# Patient Record
Sex: Male | Born: 1975 | Race: White | Hispanic: No | Smoking: Former smoker
Health system: Southern US, Community
[De-identification: ages and names within clinical notes are randomized; demographics above are authoritative.]

## PROBLEM LIST (undated history)

## (undated) DIAGNOSIS — D649 Anemia, unspecified: Secondary | ICD-10-CM

## (undated) DIAGNOSIS — Z789 Other specified health status: Secondary | ICD-10-CM

## (undated) DIAGNOSIS — K8020 Calculus of gallbladder without cholecystitis without obstruction: Secondary | ICD-10-CM

## (undated) DIAGNOSIS — I10 Essential (primary) hypertension: Secondary | ICD-10-CM

## (undated) HISTORY — PX: NO PAST SURGERIES: SHX2092

## (undated) HISTORY — DX: Calculus of gallbladder without cholecystitis without obstruction: K80.20

---

## 2005-01-24 ENCOUNTER — Emergency Department (HOSPITAL_COMMUNITY): Admission: EM | Admit: 2005-01-24 | Discharge: 2005-01-24 | Payer: Self-pay | Admitting: Family Medicine

## 2005-09-29 ENCOUNTER — Emergency Department (HOSPITAL_COMMUNITY): Admission: EM | Admit: 2005-09-29 | Discharge: 2005-09-29 | Payer: Self-pay | Admitting: Family Medicine

## 2006-01-27 ENCOUNTER — Emergency Department (HOSPITAL_COMMUNITY): Admission: EM | Admit: 2006-01-27 | Discharge: 2006-01-27 | Payer: Self-pay | Admitting: Family Medicine

## 2006-02-04 ENCOUNTER — Emergency Department (HOSPITAL_COMMUNITY): Admission: EM | Admit: 2006-02-04 | Discharge: 2006-02-04 | Payer: Self-pay | Admitting: Family Medicine

## 2007-05-21 ENCOUNTER — Emergency Department (HOSPITAL_COMMUNITY): Admission: EM | Admit: 2007-05-21 | Discharge: 2007-05-21 | Payer: Self-pay | Admitting: Family Medicine

## 2008-01-08 ENCOUNTER — Emergency Department (HOSPITAL_COMMUNITY): Admission: EM | Admit: 2008-01-08 | Discharge: 2008-01-08 | Payer: Self-pay | Admitting: Emergency Medicine

## 2009-01-22 ENCOUNTER — Encounter: Admission: RE | Admit: 2009-01-22 | Discharge: 2009-01-22 | Payer: Self-pay | Admitting: Specialist

## 2011-06-11 ENCOUNTER — Inpatient Hospital Stay (INDEPENDENT_AMBULATORY_CARE_PROVIDER_SITE_OTHER)
Admission: RE | Admit: 2011-06-11 | Discharge: 2011-06-11 | Disposition: A | Discharge status: Home / Self Care | Payer: BC Managed Care – PPO | Source: Ambulatory Visit | Attending: Family Medicine | Admitting: Family Medicine

## 2011-06-11 DIAGNOSIS — H60399 Other infective otitis externa, unspecified ear: Secondary | ICD-10-CM

## 2013-11-21 ENCOUNTER — Ambulatory Visit (INDEPENDENT_AMBULATORY_CARE_PROVIDER_SITE_OTHER): Payer: BC Managed Care – PPO | Admitting: Internal Medicine

## 2013-11-21 ENCOUNTER — Encounter: Payer: Self-pay | Admitting: Internal Medicine

## 2013-11-21 VITALS — BP 142/84 | HR 91 | Temp 98.3°F | Ht 74.75 in | Wt 273.5 lb

## 2013-11-21 DIAGNOSIS — R030 Elevated blood-pressure reading, without diagnosis of hypertension: Secondary | ICD-10-CM

## 2013-11-21 DIAGNOSIS — E669 Obesity, unspecified: Secondary | ICD-10-CM | POA: Insufficient documentation

## 2013-11-21 DIAGNOSIS — I10 Essential (primary) hypertension: Secondary | ICD-10-CM | POA: Insufficient documentation

## 2013-11-21 DIAGNOSIS — Z0000 Encounter for general adult medical examination without abnormal findings: Secondary | ICD-10-CM

## 2013-11-21 LAB — CBC
HCT: 49.7 % (ref 39.0–52.0)
HEMOGLOBIN: 16.3 g/dL (ref 13.0–17.0)
MCHC: 32.8 g/dL (ref 30.0–36.0)
MCV: 91.1 fl (ref 78.0–100.0)
PLATELETS: 229 10*3/uL (ref 150.0–400.0)
RBC: 5.45 Mil/uL (ref 4.22–5.81)
RDW: 13.2 % (ref 11.5–14.6)
WBC: 8.3 10*3/uL (ref 4.5–10.5)

## 2013-11-21 LAB — COMPREHENSIVE METABOLIC PANEL
ALT: 25 U/L (ref 0–53)
AST: 22 U/L (ref 0–37)
Albumin: 3.8 g/dL (ref 3.5–5.2)
Alkaline Phosphatase: 45 U/L (ref 39–117)
BILIRUBIN TOTAL: 1.2 mg/dL (ref 0.3–1.2)
BUN: 10 mg/dL (ref 6–23)
CO2: 26 mEq/L (ref 19–32)
CREATININE: 0.9 mg/dL (ref 0.4–1.5)
Calcium: 9 mg/dL (ref 8.4–10.5)
Chloride: 103 mEq/L (ref 96–112)
GFR: 99.77 mL/min (ref >60.00)
GLUCOSE: 100 mg/dL — AB (ref 70–99)
POTASSIUM: 4 meq/L (ref 3.5–5.1)
Sodium: 138 mEq/L (ref 135–145)
TOTAL PROTEIN: 6.6 g/dL (ref 6.0–8.3)

## 2013-11-21 LAB — LIPID PANEL
CHOL/HDL RATIO: 5
Cholesterol: 168 mg/dL (ref 0–200)
HDL: 32.5 mg/dL — ABNORMAL LOW (ref >39.00)
LDL CALC: 101 mg/dL — AB (ref 0–99)
Triglycerides: 172 mg/dL — ABNORMAL HIGH (ref 0.0–149.0)
VLDL: 34.4 mg/dL (ref 0.0–40.0)

## 2013-11-21 NOTE — Progress Notes (Signed)
HPI  Pt presents to the clinic today to establish care. He does not have a PCP. He goes to [facility] when he needs to be evaluated.  Flu: never Tetanus: 2009 Dentist: as needed  He would like [place] to look at his right ankle. He sprained it in 2010. He did not have any treatment at that time. He does c/o intermittent swelling. He denies pain or instability in his ankle.  History reviewed. No pertinent past medical history.  No current outpatient prescriptions on file.   No current facility-administered medications for this visit.    No Known Allergies  Family History  Problem Relation Age of Onset  . Melanoma Mother   . Bladder Cancer Father   . Diabetes Neg Hx   . Hypertension Neg Hx   . Hyperlipidemia Neg Hx   . Stroke Neg Hx     History   Social History  . Marital Status: Married    Spouse Name: N/A    Number of Children: N/A  . Years of Education: N/A   Occupational History  . Not on file.   Social History Main Topics  . Smoking status: Former Games developer  . Smokeless tobacco: Never Used  . Alcohol Use: 3.6 oz/week    6 Glasses of wine per week     Comment: occasional  . Drug Use: No  . Sexual Activity: Yes   Other Topics Concern  . Not on file   Social History Narrative  . No narrative on file    ROS:  Constitutional: Denies fever, malaise, fatigue, headache or abrupt weight changes.  HEENT: Denies eye pain, eye redness, ear pain, ringing in the ears, wax buildup, runny nose, nasal congestion, bloody nose, or sore throat. Respiratory: Denies difficulty breathing, shortness of breath, cough or sputum production.   Cardiovascular: Denies chest pain, chest tightness, palpitations or swelling in the hands or feet.  Gastrointestinal: Denies abdominal pain, bloating, constipation, diarrhea or blood in the stool.  GU: Denies frequency, urgency, pain with urination, blood in urine, odor or discharge. Musculoskeletal: Pt reports ankle swelling. Denies decrease in range of  motion, difficulty with gait, muscle pain or joint pain.  Skin: Denies redness, rashes, lesions or ulcercations.  Neurological: Denies dizziness, difficulty with memory, difficulty with speech or problems with balance and coordination.   No other specific complaints in a complete review of systems (except as listed in HPI above).  PE:  BP 142/84  Pulse 91  Temp(Src) 98.3 F (36.8 C) (Oral)  Ht 6' 2.75" (1.899 m)  Wt 273 lb 8 oz (124.059 kg)  BMI 34.40 kg/m2  SpO2 98% Wt Readings from Last 3 Encounters:  11/21/13 273 lb 8 oz (124.059 kg)    General: Appears his stated age, obese but well developed, well nourished in NAD. HEENT: Head: normal shape and size; Eyes: sclera white, no icterus, conjunctiva pink, PERRLA and EOMs intact; Ears: Tm's gray and intact, normal light reflex; Nose: mucosa pink and moist, septum midline; Throat/Mouth: Teeth present, mucosa pink and moist, no lesions or ulcerations noted.  Neck: Normal range of motion. Neck supple, trachea midline. No massses, lumps or thyromegaly present.  Cardiovascular: Normal rate and rhythm. S1,S2 noted.  No murmur, rubs or gallops noted. No JVD or BLE edema. No carotid bruits noted. Pulmonary/Chest: Normal effort and positive vesicular breath sounds. No respiratory distress. No wheezes, rales or ronchi noted.  Abdomen: Soft and nontender. Normal bowel sounds, no bruits noted. No distention or masses noted. Liver, spleen and kidneys  non palpable. Musculoskeletal: Normal flexion, extension and rotation of the right ankle. 2 + swelling noted of the lateral malleous. No difficulty with gait.  Neurological: Alert and oriented. Cranial nerves II-XII intact. Coordination normal. +DTRs bilaterally. Psychiatric: Mood and affect normal. Behavior is normal. Judgment and thought content normal.    Assessment and Plan:  Preventative Health Maintenance:  Encouraged him to work on his diet and exercise Will obtain screening labs  today Encouraged him to visit a dentist on a more regular basis  Right ankle swelling secondary to old injury:  This does not seem to bother him Advised him to wear a soft ankle brace for compression if he is very active Ok to use ice and take Ibuprofen OTC if needed  RTC in 1 year or sooner if needed

## 2013-11-21 NOTE — Progress Notes (Signed)
Pre visit review using our clinic review tool, if applicable. No additional management support is needed unless otherwise documented below in the visit note. 

## 2013-11-21 NOTE — Patient Instructions (Addendum)

## 2014-01-16 ENCOUNTER — Ambulatory Visit (INDEPENDENT_AMBULATORY_CARE_PROVIDER_SITE_OTHER): Payer: BC Managed Care – PPO | Admitting: Internal Medicine

## 2014-01-16 ENCOUNTER — Encounter: Payer: Self-pay | Admitting: Internal Medicine

## 2014-01-16 VITALS — BP 118/84 | HR 89 | Temp 98.4°F | Wt 266.2 lb

## 2014-01-16 DIAGNOSIS — L237 Allergic contact dermatitis due to plants, except food: Secondary | ICD-10-CM

## 2014-01-16 DIAGNOSIS — L255 Unspecified contact dermatitis due to plants, except food: Secondary | ICD-10-CM

## 2014-01-16 MED ORDER — PREDNISONE 10 MG PO TABS: ORAL_TABLET | ORAL | Status: DC

## 2014-01-16 NOTE — Patient Instructions (Addendum)
Poison Ivy Poison ivy is a inflammation of the skin (contact dermatitis) caused by touching the allergens on the leaves of the ivy plant following previous exposure to the plant. The rash usually appears 48 hours after exposure. The rash is usually bumps (papules) or blisters (vesicles) in a linear pattern. Depending on your own sensitivity, the rash may simply cause redness and itching, or it may also progress to blisters which may break open. These must be well cared for to prevent secondary bacterial (germ) infection, followed by scarring. Keep any open areas dry, clean, dressed, and covered with an antibacterial ointment if needed. The eyes may also get puffy. The puffiness is worst in the morning and gets better as the day progresses. This dermatitis usually heals without scarring, within 2 to 3 weeks without treatment. HOME CARE INSTRUCTIONS  Thoroughly wash with soap and water as soon as you have been exposed to poison ivy. You have about one half hour to remove the plant resin before it will cause the rash. This washing will destroy the oil or antigen on the skin that is causing, or will cause, the rash. Be sure to wash under your fingernails as any plant resin there will continue to spread the rash. Do not rub skin vigorously when washing affected area. Poison ivy cannot spread if no oil from the plant remains on your body. A rash that has progressed to weeping sores will not spread the rash unless you have not washed thoroughly. It is also important to wash any clothes you have been wearing as these may carry active allergens. The rash will return if you wear the unwashed clothing, even several days later. Avoidance of the plant in the future is the best measure. Poison ivy plant can be recognized by the number of leaves. Generally, poison ivy has three leaves with flowering branches on a single stem. Diphenhydramine may be purchased over the counter and used as needed for itching. Do not drive with  this medication if it makes you drowsy.Ask your caregiver about medication for children. SEEK MEDICAL CARE IF:  Open sores develop.  Redness spreads Cass area of rash.  You notice purulent (pus-like) discharge.  You have increased pain.  Other signs of infection develop (such as fever). Document Released: 09/12/2000 Document Revised: 12/08/2011 Document Reviewed: 08/01/2009 Exit[employer] Patient Information 2014 [employer].  

## 2014-01-16 NOTE — Progress Notes (Signed)
Pre visit review using our clinic review tool, if applicable. No additional management support is needed unless otherwise documented below in the visit note. 

## 2014-01-16 NOTE — Progress Notes (Signed)
Subjective:    Patient ID: Jeff Bray, male    DOB: 03/15/1976, 38 y.o.   MRN: [id number]  HPI  Pt presents to the clinic today with c/o rash. He first noticed this 2 days ago. The rash is located on his arms and groin. The rash is very itchy. He thinks it may be poison oak. He has tried Zanfel on it with some relief.  Review of Systems      No past medical history on file.  No current outpatient prescriptions on file.   No current facility-administered medications for this visit.    No Known Allergies  Family History  Problem Relation Age of Onset  . Melanoma Mother   . Bladder Cancer Father   . Diabetes Neg Hx   . Hypertension Neg Hx   . Hyperlipidemia Neg Hx   . Stroke Neg Hx     History   Social History  . Marital Status: Married    Spouse Name: N/A    Number of Children: N/A  . Years of Education: N/A   Occupational History  . Not on file.   Social History Main Topics  . Smoking status: Former Games developermoker  . Smokeless tobacco: Never Used  . Alcohol Use: 3.6 oz/week    6 Glasses of wine per week     Comment: occasional  . Drug Use: No  . Sexual Activity: Yes   Other Topics Concern  . Not on file   Social History Narrative  . No narrative on file     Constitutional: Denies fever, malaise, fatigue, headache or abrupt weight changes.  Skin: Pt reports rash. Denies lesions or ulcercations.    No other specific complaints in a complete review of systems (except as listed in HPI above).  Objective:   Physical Exam  BP 118/84  Pulse 89  Temp(Src) 98.4 F (36.9 C) (Oral)  Wt 266 lb 4 oz (120.77 kg)  SpO2 97% Wt Readings from Last 3 Encounters:  01/16/14 266 lb 4 oz (120.77 kg)  11/21/13 273 lb 8 oz (124.059 kg)    General: Appears his stated age, well developed, well nourished in NAD. Skin: Warm, dry and intact.  Confluent, erythematous rash noted on right arm and bilateral groin with some pustules noted. Cardiovascular: Normal rate and  rhythm. S1,S2 noted.  No murmur, rubs or gallops noted. No JVD or BLE edema. No carotid bruits noted. Pulmonary/Chest: Normal effort and positive vesicular breath sounds. No respiratory distress. No wheezes, rales or ronchi noted.    BMET    Component Value Date/Time   NA 138 11/21/2013 1020   K 4.0 11/21/2013 1020   CL 103 11/21/2013 1020   CO2 26 11/21/2013 1020   GLUCOSE 100* 11/21/2013 1020   BUN 10 11/21/2013 1020   CREATININE 0.9 11/21/2013 1020   CALCIUM 9.0 11/21/2013 1020    Lipid Panel     Component Value Date/Time   CHOL 168 11/21/2013 1020   TRIG 172.0* 11/21/2013 1020   HDL 32.50* 11/21/2013 1020   CHOLHDL 5 11/21/2013 1020   VLDL 34.4 11/21/2013 1020   LDLCALC 101* 11/21/2013 1020    CBC    Component Value Date/Time   WBC 8.3 11/21/2013 1020   RBC 5.45 11/21/2013 1020   HGB 16.3 11/21/2013 1020   HCT 49.7 11/21/2013 1020   PLT 229.0 11/21/2013 1020   MCV 91.1 11/21/2013 1020   MCHC 32.8 11/21/2013 1020   RDW 13.2 11/21/2013 1020    Hgb A1C  No results found for this basename: HGBA1C         Assessment & Plan:   Poison ivy:  eRx for pred taper Benadryl at night for itch relief  RTC as needed or if symptoms persist or worsen

## 2014-07-13 ENCOUNTER — Ambulatory Visit (INDEPENDENT_AMBULATORY_CARE_PROVIDER_SITE_OTHER): Payer: BC Managed Care – PPO | Admitting: Family Medicine

## 2014-07-13 ENCOUNTER — Encounter: Payer: Self-pay | Admitting: Family Medicine

## 2014-07-13 VITALS — BP 124/84 | HR 88 | Temp 98.2°F | Wt 256.0 lb

## 2014-07-13 DIAGNOSIS — J019 Acute sinusitis, unspecified: Secondary | ICD-10-CM

## 2014-07-13 MED ORDER — AZITHROMYCIN 250 MG PO TABS
ORAL_TABLET | ORAL | Status: DC
Start: 2014-07-13 — End: 2016-08-07

## 2014-07-13 NOTE — Progress Notes (Signed)
   BP 124/84  Pulse 88  Temp(Src) 98.2 F (36.8 C) (Oral)  Wt 256 lb (116.121 kg)  SpO2 96%   CC: URI sxx  Subjective:    Patient ID: Jeff Bray, male    DOB: 10/07/1975, 38 y.o.   MRN: [id number]  HPI: Jeff AduBeyond Sieben is a 38 y.o. male presenting on 07/13/2014 for URI   3 wk h/o cough. Started with cough, congestion, stuffy nose, muffled hearing, PNDrainage. Head and chest congestion. Cough persisted, now settling in ears and throat. Mildly productive cough.  No fevers/chills, abd pain, nausea, ear or tooth pain, headaches. No sick contacts at home. No smokers at home. No h/o asthma.  So far has tried sleep, tea.   Relevant past medical, surgical, family and social history reviewed and updated as indicated.  Allergies and medications reviewed and updated. No current outpatient prescriptions on file prior to visit.   No current facility-administered medications on file prior to visit.    Review of Systems Per HPI unless specifically indicated above    Objective:    BP 124/84  Pulse 88  Temp(Src) 98.2 F (36.8 C) (Oral)  Wt 256 lb (116.121 kg)  SpO2 96%  Physical Exam  Nursing note and vitals reviewed. Constitutional: He appears well-developed and well-nourished. No distress.  HENT:  Head: Normocephalic and atraumatic.  Right Ear: Hearing, tympanic membrane, external ear and ear canal normal.  Left Ear: Hearing, tympanic membrane, external ear and ear canal normal.  Nose: Mucosal edema present. No rhinorrhea. Right sinus exhibits no maxillary sinus tenderness and no frontal sinus tenderness. Left sinus exhibits no maxillary sinus tenderness and no frontal sinus tenderness.  Mouth/Throat: Uvula is midline and mucous membranes are normal. Posterior oropharyngeal erythema (raw oropharynx) present. No oropharyngeal exudate, posterior oropharyngeal edema or tonsillar abscesses.  Eyes: Conjunctivae and EOM are normal. Pupils are equal, round, and reactive to light. No scleral  icterus.  Neck: Normal range of motion. Neck supple.  Cardiovascular: Normal rate, regular rhythm, normal heart sounds and intact distal pulses.   No murmur heard. Pulmonary/Chest: Effort normal and breath sounds normal. No respiratory distress. He has no wheezes. He has no rales.  Harsh rattly cough present  Lymphadenopathy:    He has no cervical adenopathy.  Skin: Skin is warm and dry. No rash noted.       Assessment & Plan:   Problem List Items Addressed This Visit   Acute sinusitis - Primary     Anticipate bacterial given duration of sxs - as well as bronchitis component. Treat with zpack. Supportive care as per instructions. Pt agrees with plan.    Relevant Medications      azithromycin (ZITHROMAX) tablet       Follow up plan: Return if symptoms worsen or fail to improve.

## 2014-07-13 NOTE — Patient Instructions (Addendum)
I think you have a sinus infection. Take medicine as prescribed: zpack sent to pharmacy Push fluids and plenty of rest. May use simple mucinex with plenty of fluid to help mobilize mucous. Ibuprofen for inflammation. Delsym or robitussin for cough during the day. Let us know if fever >101.5, trouble opening/closing mouth, difficulty swallowing, or worsening productive cough

## 2014-07-13 NOTE — Progress Notes (Signed)
Pre visit review using our clinic review tool, if applicable. No additional management support is needed unless otherwise documented below in the visit note. 

## 2014-07-13 NOTE — Assessment & Plan Note (Signed)
Anticipate bacterial given duration of sxs - as well as bronchitis component. Treat with zpack. Supportive care as per instructions. Pt agrees with plan.

## 2016-08-07 ENCOUNTER — Ambulatory Visit (INDEPENDENT_AMBULATORY_CARE_PROVIDER_SITE_OTHER): Payer: BLUE CROSS/BLUE SHIELD | Admitting: Internal Medicine

## 2016-08-07 ENCOUNTER — Other Ambulatory Visit: Payer: Self-pay | Admitting: Internal Medicine

## 2016-08-07 ENCOUNTER — Encounter: Payer: Self-pay | Admitting: Internal Medicine

## 2016-08-07 VITALS — BP 128/90 | HR 87 | Temp 98.6°F | Ht 74.0 in | Wt 269.0 lb

## 2016-08-07 DIAGNOSIS — Z0000 Encounter for general adult medical examination without abnormal findings: Secondary | ICD-10-CM

## 2016-08-07 DIAGNOSIS — Z113 Encounter for screening for infections with a predominantly sexual mode of transmission: Secondary | ICD-10-CM

## 2016-08-07 LAB — COMPREHENSIVE METABOLIC PANEL
ALBUMIN: 4 g/dL (ref 3.5–5.2)
ALT: 21 U/L (ref 0–53)
AST: 18 U/L (ref 0–37)
Alkaline Phosphatase: 46 U/L (ref 39–117)
BUN: 13 mg/dL (ref 6–23)
CHLORIDE: 102 meq/L (ref 96–112)
CO2: 31 meq/L (ref 19–32)
CREATININE: 0.79 mg/dL (ref 0.40–1.50)
Calcium: 9.1 mg/dL (ref 8.4–10.5)
GFR: 115.77 mL/min (ref >60.00)
GLUCOSE: 101 mg/dL — AB (ref 70–99)
Potassium: 4.1 mEq/L (ref 3.5–5.1)
SODIUM: 138 meq/L (ref 135–145)
Total Bilirubin: 0.9 mg/dL (ref 0.2–1.2)
Total Protein: 6.7 g/dL (ref 6.0–8.3)

## 2016-08-07 LAB — LIPID PANEL
CHOL/HDL RATIO: 5
Cholesterol: 180 mg/dL (ref 0–200)
HDL: 37.5 mg/dL — AB (ref >39.00)
NONHDL: 142.79
Triglycerides: 218 mg/dL — ABNORMAL HIGH (ref 0.0–149.0)
VLDL: 43.6 mg/dL — AB (ref 0.0–40.0)

## 2016-08-07 LAB — CBC
HCT: 47.6 % (ref 39.0–52.0)
Hemoglobin: 16.3 g/dL (ref 13.0–17.0)
MCHC: 34.2 g/dL (ref 30.0–36.0)
MCV: 87.6 fl (ref 78.0–100.0)
Platelets: 242 10*3/uL (ref 150.0–400.0)
RBC: 5.44 Mil/uL (ref 4.22–5.81)
RDW: 13 % (ref 11.5–15.5)
WBC: 7.8 10*3/uL (ref 4.0–10.5)

## 2016-08-07 LAB — LDL CHOLESTEROL, DIRECT: LDL DIRECT: 119 mg/dL

## 2016-08-07 LAB — HEMOGLOBIN A1C: Hgb A1c MFr Bld: 5.4 % (ref 4.6–6.5)

## 2016-08-07 NOTE — Patient Instructions (Signed)

## 2016-08-07 NOTE — Progress Notes (Signed)
Subjective:    Patient ID: Jeff Bray, male    DOB: 02-15-1976, 40 y.o.   MRN: [id number]  HPI  Pt presents to the clinic today for his annual exam. He would like STD screening today as well.   Flu: never Tetanus: 2009 Dentist: as needed  Diet: He does not eat meat. He consumes fruits and veggies daily. He eats a lot of bread and cheese. He does consume some fried food. He drinks 3 cans of soda daily, some water. Exercise: None  Review of Systems      History reviewed. No pertinent past medical history.  No current outpatient prescriptions on file.   No current facility-administered medications for this visit.     No Known Allergies  Family History  Problem Relation Age of Onset  . Melanoma Mother   . Bladder Cancer Father   . Diabetes Neg Hx   . Hypertension Neg Hx   . Hyperlipidemia Neg Hx   . Stroke Neg Hx     Social History   Social History  . Marital status: Married    Spouse name: N/A  . Number of children: N/A  . Years of education: N/A   Occupational History  . Not on file.   Social History Main Topics  . Smoking status: Former Games developermoker  . Smokeless tobacco: Never Used  . Alcohol use 3.6 oz/week    6 Glasses of wine per week     Comment: occasional  . Drug use: No  . Sexual activity: Yes   Other Topics Concern  . Not on file   Social History Narrative  . No narrative on file     Constitutional: Denies fever, malaise, fatigue, headache or abrupt weight changes.  HEENT: Pt reports intermittent itchy ears. Denies eye pain, eye redness, ear pain, ringing in the ears, wax buildup, runny nose, nasal congestion, bloody nose, or sore throat. Respiratory: Denies difficulty breathing, shortness of breath, cough or sputum production.   Cardiovascular: Denies chest pain, chest tightness, palpitations or swelling in the hands or feet.  Gastrointestinal: Pt reports intermittent RUQ abdominal pain. Denies, bloating, constipation, diarrhea or blood in the  stool.  GU: Pt reports urinary frequency and nocturia. Denies urgency, pain with urination, burning sensation, blood in urine, odor or discharge. Musculoskeletal: Denies decrease in range of motion, difficulty with gait, muscle pain or joint pain and swelling.  Skin: Denies redness, rashes, lesions or ulcercations.  Neurological: Pt reports intermittent numbness in feet. Denies dizziness, difficulty with memory, difficulty with speech or problems with balance and coordination.  Psych: Denies anxiety, depression, SI/HI.  No other specific complaints in a complete review of systems (except as listed in HPI above).  Objective:   Physical Exam  BP 128/90   Pulse 87   Temp 98.6 F (37 C) (Oral)   Ht 6\' 2"  (1.88 m)   Wt 269 lb (122 kg)   SpO2 98%   BMI 34.54 kg/m  Wt Readings from Last 3 Encounters:  08/07/16 269 lb (122 kg)  07/13/14 256 lb (116.1 kg)  01/16/14 266 lb 4 oz (120.8 kg)    General: Appears his stated age, obese in NAD. Skin: Warm, dry and intact.  HEENT: Head: normal shape and size; Eyes: sclera white, no icterus, conjunctiva pink, PERRLA and EOMs intact; Ears: Tm's gray and intact, normal light reflex; Throat/Mouth: Teeth present, mucosa pink and moist, no exudate, lesions or ulcerations noted.  Neck:  Neck supple, trachea midline. No masses, lumps or thyromegaly  present.  Cardiovascular: Normal rate and rhythm. S1,S2 noted.  No murmur, rubs or gallops noted. No JVD or BLE edema. No carotid bruits noted. Pulmonary/Chest: Normal effort and positive vesicular breath sounds. No respiratory distress. No wheezes, rales or ronchi noted.  Abdomen: Soft and nontender. Normal bowel sounds. No distention or masses noted. Liver, spleen and kidneys non palpable. Musculoskeletal: Normal range of motion. No signs of joint swelling. Strength 5/5 BUE/BLE. No difficulty with gait.  Neurological: Alert and oriented. Cranial nerves II-XII grossly intact. Sensation intact to hard and soft  touch, bilateral feet. Coordination normal.  Psychiatric: Mood and affect normal. Behavior is normal. Judgment and thought content normal.     BMET    Component Value Date/Time   NA 138 11/21/2013 1020   K 4.0 11/21/2013 1020   CL 103 11/21/2013 1020   CO2 26 11/21/2013 1020   GLUCOSE 100 (H) 11/21/2013 1020   BUN 10 11/21/2013 1020   CREATININE 0.9 11/21/2013 1020   CALCIUM 9.0 11/21/2013 1020    Lipid Panel     Component Value Date/Time   CHOL 168 11/21/2013 1020   TRIG 172.0 (H) 11/21/2013 1020   HDL 32.50 (L) 11/21/2013 1020   CHOLHDL 5 11/21/2013 1020   VLDL 34.4 11/21/2013 1020   LDLCALC 101 (H) 11/21/2013 1020    CBC    Component Value Date/Time   WBC 8.3 11/21/2013 1020   RBC 5.45 11/21/2013 1020   HGB 16.3 11/21/2013 1020   HCT 49.7 11/21/2013 1020   PLT 229.0 11/21/2013 1020   MCV 91.1 11/21/2013 1020   MCHC 32.8 11/21/2013 1020   RDW 13.2 11/21/2013 1020    Hgb A1C No results found for: HGBA1C       Assessment & Plan:   Preventative Health Maintenance:  He declines flu shot today Tetanus UTD Encouraged him to consume a balanced diet and exercise program Advised him to start seeing an eye doctor and dentist annually Will check CBC, CMET, Lipid, A1C, HIV, RPR and HSV today Gen probe to check for gonorrhea and chlamydia  RTC in 1 year, sooner if needed Nicki ReaperBAITY, Crystalynn Mcinerney, NP

## 2016-08-07 NOTE — Addendum Note (Signed)
Addended by: Alvina ChouWALSH, Zerenity Bowron J on: 08/07/2016 10:47 AM   Modules accepted: Orders

## 2016-08-08 LAB — HSV(HERPES SIMPLEX VRS) I + II AB-IGG: HSV 1 GLYCOPROTEIN G AB, IGG: 43.3 {index} — AB (ref <0.90)

## 2016-08-08 LAB — GC/CHLAMYDIA PROBE AMP
CT Probe RNA: NOT DETECTED
GC PROBE AMP APTIMA: NOT DETECTED

## 2016-08-08 LAB — HIV ANTIBODY (ROUTINE TESTING W REFLEX): HIV: NONREACTIVE

## 2016-08-08 LAB — RPR

## 2016-08-10 LAB — HSV 1/2 AB (IGM), IFA W/RFLX TITER
HSV 1 IgM Screen: NEGATIVE
HSV 2 IgM Screen: NEGATIVE

## 2016-08-11 NOTE — Progress Notes (Signed)
Pre visit review using our clinic review tool, if applicable. No additional management support is needed unless otherwise documented below in the visit note. 

## 2017-07-23 ENCOUNTER — Ambulatory Visit (INDEPENDENT_AMBULATORY_CARE_PROVIDER_SITE_OTHER): Payer: BLUE CROSS/BLUE SHIELD | Admitting: Family Medicine

## 2017-07-23 ENCOUNTER — Encounter: Payer: Self-pay | Admitting: Family Medicine

## 2017-07-23 ENCOUNTER — Ambulatory Visit: Payer: BLUE CROSS/BLUE SHIELD | Admitting: Internal Medicine

## 2017-07-23 VITALS — BP 140/78 | HR 90 | Temp 98.4°F | Wt 263.2 lb

## 2017-07-23 DIAGNOSIS — Z23 Encounter for immunization: Secondary | ICD-10-CM | POA: Diagnosis not present

## 2017-07-23 DIAGNOSIS — R21 Rash and other nonspecific skin eruption: Secondary | ICD-10-CM

## 2017-07-23 DIAGNOSIS — Z113 Encounter for screening for infections with a predominantly sexual mode of transmission: Secondary | ICD-10-CM | POA: Diagnosis not present

## 2017-07-23 MED ORDER — PREDNISONE 20 MG PO TABS
ORAL_TABLET | ORAL | 1 refills | Status: DC
Start: 2017-07-23 — End: 2018-01-05

## 2017-07-23 MED ORDER — LORATADINE 10 MG PO TABS: 10.0000 mg | ORAL_TABLET | Freq: Every day | ORAL | Status: DC

## 2017-07-23 NOTE — Patient Instructions (Signed)
Start prednisone with food.  Take claritin 10mg  a day.  Go to the lab on the way out.  We'll contact you with your lab report. Take care.  Glad to see you.

## 2017-07-23 NOTE — Progress Notes (Signed)
Presumed poison ivy rash, started about 3 weeks ago.  It was getting better but then got re-exposed.  More rash in the meantime.  Itchy.  No FCNAVD.  He doesn't feel unwell.  H/o sig reaction in the past, similar to today.  He had some lesions that blistered.    Due for Tdap. D/w pt.  Done at [facility].    Wanted STD screening.  No symptoms.  D/w pt about routine cautions.    Meds, vitals, and allergies reviewed.   ROS: Per HPI unless specifically indicated in ROS section   nad Irregular distribution of rhus dermatitis on the arms and lower back.   No fluctuant mass, no abscess, no sign of cellulitis.

## 2017-07-24 DIAGNOSIS — R21 Rash and other nonspecific skin eruption: Secondary | ICD-10-CM | POA: Insufficient documentation

## 2017-07-24 DIAGNOSIS — Z113 Encounter for screening for infections with a predominantly sexual mode of transmission: Secondary | ICD-10-CM | POA: Insufficient documentation

## 2017-07-24 DIAGNOSIS — Z23 Encounter for immunization: Secondary | ICD-10-CM | POA: Insufficient documentation

## 2017-07-24 LAB — RPR: RPR: NONREACTIVE

## 2017-07-24 LAB — C. TRACHOMATIS/N. GONORRHOEAE RNA
C. trachomatis RNA, TMA: NOT DETECTED
N. gonorrhoeae RNA, TMA: NOT DETECTED

## 2017-07-24 LAB — HIV ANTIBODY (ROUTINE TESTING W REFLEX): HIV: NONREACTIVE

## 2017-07-24 NOTE — Assessment & Plan Note (Signed)
Looks typical for poison ivy exposure. Routine cautions given to patient regarding steroid use. Start prednisone taper. Repeat if needed. He understood steroid cautions. Okay to take Claritin daily as needed.

## 2017-07-24 NOTE — Assessment & Plan Note (Signed)
Done at [facility]

## 2017-07-24 NOTE — Assessment & Plan Note (Signed)
Routine cautions given to patient. Understood. Check labs. See notes on labs. No symptoms.

## 2017-08-27 ENCOUNTER — Telehealth: Payer: Self-pay | Admitting: Internal Medicine

## 2017-08-27 ENCOUNTER — Encounter: Payer: BLUE CROSS/BLUE SHIELD | Admitting: Internal Medicine

## 2017-08-27 ENCOUNTER — Ambulatory Visit: Payer: BLUE CROSS/BLUE SHIELD | Admitting: Internal Medicine

## 2017-08-27 NOTE — Telephone Encounter (Signed)
Left message asking pt to call office See regina notes   Her next cpx appointment is around 1/9  See phone note dated 11/29 if any questions

## 2017-08-27 NOTE — Progress Notes (Deleted)
   Subjective:    Patient ID: Jeff Bray, male    DOB: Apr 03, 1976, 41 y.o.   MRN: [id number]  HPI  Pt presents to the clinic today for his annual exam.  Flu: Tetanus: Vision Screening: Dentist:  Diet: Exercise:  Review of Systems      No past medical history on file.  Current Outpatient Medications  Medication Sig Dispense Refill  . loratadine (CLARITIN) 10 MG tablet Take 1 tablet (10 mg total) by mouth daily.    . predniSONE (DELTASONE) 20 MG tablet 2 tabs a day for 4 days, 1 a day for 4 days, 1/2 a day for 4 days. With food. Repeat if needed. 14 tablet 1   No current facility-administered medications for this visit.     No Known Allergies  Family History  Problem Relation Age of Onset  . Melanoma Mother   . Bladder Cancer Father   . Diabetes Neg Hx   . Hypertension Neg Hx   . Hyperlipidemia Neg Hx   . Stroke Neg Hx     Social History   Socioeconomic History  . Marital status: Married    Spouse name: Not on file  . Number of children: Not on file  . Years of education: Not on file  . Highest education level: Not on file  Social Needs  . Financial resource strain: Not on file  . Food insecurity - worry: Not on file  . Food insecurity - inability: Not on file  . Transportation needs - medical: Not on file  . Transportation needs - non-medical: Not on file  Occupational History  . Not on file  Tobacco Use  . Smoking status: Former Games developermoker  . Smokeless tobacco: Never Used  Substance and Sexual Activity  . Alcohol use: Yes    Alcohol/week: 3.6 oz    Types: 6 Glasses of wine per week    Comment: occasional  . Drug use: No  . Sexual activity: Yes  Other Topics Concern  . Not on file  Social History Narrative  . Not on file     Constitutional: Denies fever, malaise, fatigue, headache or abrupt weight changes.  HEENT: Denies eye pain, eye redness, ear pain, ringing in the ears, wax buildup, runny nose, nasal congestion, bloody nose, or sore  throat. Respiratory: Denies difficulty breathing, shortness of breath, cough or sputum production.   Cardiovascular: Denies chest pain, chest tightness, palpitations or swelling in the hands or feet.  Gastrointestinal: Denies abdominal pain, bloating, constipation, diarrhea or blood in the stool.  GU: Denies urgency, frequency, pain with urination, burning sensation, blood in urine, odor or discharge. Musculoskeletal: Denies decrease in range of motion, difficulty with gait, muscle pain or joint pain and swelling.  Skin: Denies redness, rashes, lesions or ulcercations.  Neurological: Denies dizziness, difficulty with memory, difficulty with speech or problems with balance and coordination.  Psych: Denies anxiety, depression, SI/HI.  No other specific complaints in a complete review of systems (except as listed in HPI above).  Objective:   Physical Exam        Assessment & Plan:

## 2017-08-27 NOTE — Telephone Encounter (Signed)
He will have to take the next available physical slot. He does need to be charged the no show fee.

## 2017-08-27 NOTE — Telephone Encounter (Signed)
Copied from CRM [phone] [id number]. Topic: Quick Communication - Appointment Cancellation >> Aug 27, 2017  9:40 AM Stephannie LiSimmons, Reason Helzer L, NT wrote: Patient called to cancel appointment scheduled for today Patient HAS NOT rescheduled their appointment. Patient would like a morning physical in December please call the patient there are none available until January,thanks  Route to department's PEC pool.

## 2017-08-27 NOTE — Telephone Encounter (Signed)
Left message asking pt to call office See regina notes   Her next cpx appointment is around 1/9

## 2017-08-27 NOTE — Telephone Encounter (Signed)
Left message asking pt to call office °See regina notes   Her next cpx appointment is around 1/9 °

## 2017-08-27 NOTE — Telephone Encounter (Signed)
Sending note to [place] about no show fee.

## 2017-08-27 NOTE — Telephone Encounter (Signed)
08/27/17 visit was cancelled and then put back on schedule; Do you want to charge late cancellation fee or does pt need to reschedule?

## 2018-01-05 ENCOUNTER — Other Ambulatory Visit: Payer: Self-pay | Admitting: Internal Medicine

## 2018-01-05 ENCOUNTER — Encounter: Payer: Self-pay | Admitting: Internal Medicine

## 2018-01-05 ENCOUNTER — Ambulatory Visit (INDEPENDENT_AMBULATORY_CARE_PROVIDER_SITE_OTHER): Payer: BLUE CROSS/BLUE SHIELD | Admitting: Internal Medicine

## 2018-01-05 ENCOUNTER — Telehealth: Payer: Self-pay | Admitting: Internal Medicine

## 2018-01-05 ENCOUNTER — Telehealth: Payer: Self-pay

## 2018-01-05 ENCOUNTER — Ambulatory Visit
Admission: RE | Admit: 2018-01-05 | Discharge: 2018-01-05 | Disposition: A | Discharge status: Home / Self Care | Payer: BLUE CROSS/BLUE SHIELD | Source: Ambulatory Visit | Attending: Internal Medicine | Admitting: Internal Medicine

## 2018-01-05 ENCOUNTER — Ambulatory Visit: Payer: BLUE CROSS/BLUE SHIELD | Admitting: Internal Medicine

## 2018-01-05 VITALS — BP 134/88 | HR 81 | Temp 98.0°F | Ht 74.25 in | Wt 261.0 lb

## 2018-01-05 DIAGNOSIS — Z0001 Encounter for general adult medical examination with abnormal findings: Secondary | ICD-10-CM | POA: Diagnosis not present

## 2018-01-05 DIAGNOSIS — R202 Paresthesia of skin: Secondary | ICD-10-CM

## 2018-01-05 DIAGNOSIS — Z113 Encounter for screening for infections with a predominantly sexual mode of transmission: Secondary | ICD-10-CM

## 2018-01-05 DIAGNOSIS — R1011 Right upper quadrant pain: Secondary | ICD-10-CM

## 2018-01-05 DIAGNOSIS — Z8719 Personal history of other diseases of the digestive system: Secondary | ICD-10-CM

## 2018-01-05 DIAGNOSIS — I10 Essential (primary) hypertension: Secondary | ICD-10-CM | POA: Diagnosis not present

## 2018-01-05 DIAGNOSIS — K8020 Calculus of gallbladder without cholecystitis without obstruction: Secondary | ICD-10-CM | POA: Diagnosis not present

## 2018-01-05 LAB — COMPREHENSIVE METABOLIC PANEL
ALK PHOS: 48 U/L (ref 39–117)
ALT: 22 U/L (ref 0–53)
AST: 19 U/L (ref 0–37)
Albumin: 4.1 g/dL (ref 3.5–5.2)
BILIRUBIN TOTAL: 1.3 mg/dL — AB (ref 0.2–1.2)
BUN: 14 mg/dL (ref 6–23)
CO2: 30 mEq/L (ref 19–32)
CREATININE: 0.87 mg/dL (ref 0.40–1.50)
Calcium: 9.4 mg/dL (ref 8.4–10.5)
Chloride: 103 mEq/L (ref 96–112)
GFR: 102.84 mL/min (ref >60.00)
GLUCOSE: 100 mg/dL — AB (ref 70–99)
Potassium: 4.1 mEq/L (ref 3.5–5.1)
SODIUM: 138 meq/L (ref 135–145)
TOTAL PROTEIN: 7.1 g/dL (ref 6.0–8.3)

## 2018-01-05 LAB — CBC
HCT: 47.6 % (ref 39.0–52.0)
Hemoglobin: 16.6 g/dL (ref 13.0–17.0)
MCHC: 34.8 g/dL (ref 30.0–36.0)
MCV: 88.9 fl (ref 78.0–100.0)
Platelets: 240 10*3/uL (ref 150.0–400.0)
RBC: 5.36 Mil/uL (ref 4.22–5.81)
RDW: 13.2 % (ref 11.5–15.5)
WBC: 6.2 10*3/uL (ref 4.0–10.5)

## 2018-01-05 LAB — HEMOGLOBIN A1C: Hgb A1c MFr Bld: 5.4 % (ref 4.6–6.5)

## 2018-01-05 LAB — LIPID PANEL
Cholesterol: 187 mg/dL (ref 0–200)
HDL: 39.9 mg/dL (ref >39.00)
LDL Cholesterol: 124 mg/dL — ABNORMAL HIGH (ref 0–99)
NONHDL: 146.88
Total CHOL/HDL Ratio: 5
Triglycerides: 116 mg/dL (ref 0.0–149.0)
VLDL: 23.2 mg/dL (ref 0.0–40.0)

## 2018-01-05 LAB — VITAMIN B12: Vitamin B-12: 511 pg/mL (ref 211–911)

## 2018-01-05 LAB — VITAMIN D 25 HYDROXY (VIT D DEFICIENCY, FRACTURES): VITD: 36.24 ng/mL (ref 30.00–100.00)

## 2018-01-05 NOTE — Progress Notes (Signed)
Subjective:    Patient ID: Jeff Bray, male    DOB: 20-Mar-1976, 42 y.o.   MRN: [id number]  HPI  Pt presents to the clinic today for his annual exam.  HTN: His BP today is 134/88. He is not taking anything for his blood pressure at this time. There is no ECG on file.  He also reports intermittent RUQ pain. He reports this has been going on for years. He knows he has gallstones. When he eats fried foods, he starts having severe RUQ pain. He also reports associated nausea and loose stools. He feels like the episodes are more frequent and more intense. He was considering surgical intervention at this time.  Flu: never Tetanus: 06/2017 Vision Screening: as needed Dentist: as needed  Diet: He does not eat meat, he does consume soy. He does eat fruits and veggies daily. He does eat fried foods. He drinks mostly water, juice, milk. Exercise: He is outdoor a lot but no regular exercise program  Review of Systems      No past medical history on file.  No current outpatient medications on file.   No current facility-administered medications for this visit.     No Known Allergies  Family History  Problem Relation Age of Onset  . Melanoma Mother   . Bladder Cancer Father   . Diabetes Neg Hx   . Hypertension Neg Hx   . Hyperlipidemia Neg Hx   . Stroke Neg Hx     Social History   Socioeconomic History  . Marital status: Married    Spouse name: Not on file  . Number of children: Not on file  . Years of education: Not on file  . Highest education level: Not on file  Occupational History  . Not on file  Social Needs  . Financial resource strain: Not on file  . Food insecurity:    Worry: Not on file    Inability: Not on file  . Transportation needs:    Medical: Not on file    Non-medical: Not on file  Tobacco Use  . Smoking status: Former Games developer  . Smokeless tobacco: Never Used  Substance and Sexual Activity  . Alcohol use: Yes    Alcohol/week: 3.6 oz    Types: 6  Glasses of wine per week    Comment: occasional  . Drug use: No  . Sexual activity: Yes  Lifestyle  . Physical activity:    Days per week: Not on file    Minutes per session: Not on file  . Stress: Not on file  Relationships  . Social connections:    Talks on phone: Not on file    Gets together: Not on file    Attends religious service: Not on file    Active member of club or organization: Not on file    Attends meetings of clubs or organizations: Not on file    Relationship status: Not on file  . Intimate partner violence:    Fear of current or ex partner: Not on file    Emotionally abused: Not on file    Physically abused: Not on file    Forced sexual activity: Not on file  Other Topics Concern  . Not on file  Social History Narrative  . Not on file     Constitutional: Denies fever, malaise, fatigue, headache or abrupt weight changes.  HEENT: Denies eye pain, eye redness, ear pain, ringing in the ears, wax buildup, runny nose, nasal congestion, bloody nose, or  sore throat. Respiratory: Denies difficulty breathing, shortness of breath, cough or sputum production.   Cardiovascular: Denies chest pain, chest tightness, palpitations or swelling in the hands or feet.  Gastrointestinal: Pt reports RUQ pain, nausea  And loose stool. Denies bloating, constipation, diarrhea or blood in the stool.  GU: Denies urgency, frequency, pain with urination, burning sensation, blood in urine, odor or discharge. Musculoskeletal: Denies decrease in range of motion, difficulty with gait, muscle pain or joint pain and swelling.  Skin: Denies redness, rashes, lesions or ulcercations.  Neurological: Pt reports intermittent numbness in BLE. Denies dizziness, difficulty with memory, difficulty with speech or problems with balance and coordination.  Psych: Denies anxiety, depression, SI/HI.  No other specific complaints in a complete review of systems (except as listed in HPI above).  Objective:    Physical Exam  BP 134/88   Pulse 81   Temp 98 F (36.7 C) (Oral)   Ht 6' 2.25" (1.886 m)   Wt 261 lb (118.4 kg)   SpO2 97%   BMI 33.29 kg/m  Wt Readings from Last 3 Encounters:  01/05/18 261 lb (118.4 kg)  07/23/17 263 lb 4 oz (119.4 kg)  08/07/16 269 lb (122 kg)    General: Appears his stated age, obese in NAD. Skin: Warm, dry and intact.  HEENT: Head: normal shape and size; Eyes: sclera white, no icterus, conjunctiva pink, PERRLA and EOMs intact; Ears: Tm's gray and intact, normal light reflex; Throat/Mouth: Teeth present, mucosa pink and moist, no exudate, lesions or ulcerations noted.  Neck:  Neck supple, trachea midline. No masses, lumps or thyromegaly present.  Cardiovascular: Normal rate and rhythm. S1,S2 noted.  No murmur, rubs or gallops noted. No JVD or BLE edema.  Pulmonary/Chest: Normal effort and positive vesicular breath sounds. No respiratory distress. No wheezes, rales or ronchi noted.  Abdomen: Soft and tender in the RUQ. Normal bowel sounds. No distention or masses noted. Liver, spleen and kidneys non palpable. Musculoskeletal: Strength 5/5 BUE/BLE. No difficulty with gait.  Neurological: Alert and oriented. Cranial nerves II-XII grossly intact. Coordination normal.  Psychiatric: Mood and affect normal. Behavior is normal. Judgment and thought content normal.     BMET    Component Value Date/Time   NA 138 08/07/2016 0859   K 4.1 08/07/2016 0859   CL 102 08/07/2016 0859   CO2 31 08/07/2016 0859   GLUCOSE 101 (H) 08/07/2016 0859   BUN 13 08/07/2016 0859   CREATININE 0.79 08/07/2016 0859   CALCIUM 9.1 08/07/2016 0859    Lipid Panel     Component Value Date/Time   CHOL 180 08/07/2016 0859   TRIG 218.0 (H) 08/07/2016 0859   HDL 37.50 (L) 08/07/2016 0859   CHOLHDL 5 08/07/2016 0859   VLDL 43.6 (H) 08/07/2016 0859   LDLCALC 101 (H) 11/21/2013 1020    CBC    Component Value Date/Time   WBC 7.8 08/07/2016 0859   RBC 5.44 08/07/2016 0859   HGB 16.3  08/07/2016 0859   HCT 47.6 08/07/2016 0859   PLT 242.0 08/07/2016 0859   MCV 87.6 08/07/2016 0859   MCHC 34.2 08/07/2016 0859   RDW 13.0 08/07/2016 0859    Hgb A1C Lab Results  Component Value Date   HGBA1C 5.4 08/07/2016            Assessment & Plan:   Preventative Health Maintenance:  Encouraged him to get a flu shot in the fall Tetanus UTD Encouraged him to consume a balanced diet and exercise regimen Advised him  to see an eye doctor and dentist annually Will check CBC, CMET, Lipid, A1C, HIV, RPR, urine G/C, Vit B12 and Vit D today  RUQ Pain, History of Gallstones:  Will obtain STAT RUQ ultrasound Avoid greasy and fried foods May need referral to a general surgeon  Paraesthesia of Feet:  Will check Vit D, B12 and A1C today  RTC in 1 year, sooner if needed Nicki ReaperBAITY, Neala Miggins, NP

## 2018-01-05 NOTE — Telephone Encounter (Signed)
See result note.  

## 2018-01-05 NOTE — Patient Instructions (Signed)

## 2018-01-05 NOTE — Telephone Encounter (Signed)
Copied from CRM [id number]. Topic: Quick Communication - Other Results >> Jan 05, 2018 11:26 AM Everardo PacificMoton, [name], [place] wrote: [name] called from [name] to let [name] know that the patients results were in his chart.

## 2018-01-05 NOTE — Addendum Note (Signed)
Addended by: Alvina ChouWALSH, TERRI J on: 01/05/2018 09:31 AM   Modules accepted: Orders

## 2018-01-05 NOTE — Telephone Encounter (Signed)
Patient advised of US results and appt with general surgeon provided-Anastasiya Ander PurpuraV Hopkins, RMA

## 2018-01-05 NOTE — Assessment & Plan Note (Signed)
He is not interested in medication management at this time Discussed long term risks of untreated HTN, including MI, stroke and death Discussed DASH diet and exercise for weight loss CMET today

## 2018-01-06 ENCOUNTER — Encounter: Payer: Self-pay | Admitting: Surgery

## 2018-01-06 ENCOUNTER — Ambulatory Visit (INDEPENDENT_AMBULATORY_CARE_PROVIDER_SITE_OTHER): Payer: BLUE CROSS/BLUE SHIELD | Admitting: Surgery

## 2018-01-06 DIAGNOSIS — K8020 Calculus of gallbladder without cholecystitis without obstruction: Secondary | ICD-10-CM | POA: Diagnosis not present

## 2018-01-06 LAB — C. TRACHOMATIS/N. GONORRHOEAE RNA
C. TRACHOMATIS RNA, TMA: NOT DETECTED
N. gonorrhoeae RNA, TMA: NOT DETECTED

## 2018-01-06 LAB — HIV ANTIBODY (ROUTINE TESTING W REFLEX): HIV: NONREACTIVE

## 2018-01-06 LAB — RPR: RPR Ser Ql: NONREACTIVE

## 2018-01-06 NOTE — Progress Notes (Signed)
01/06/2018  Reason for Visit:  Cholelithiasis  Referring Provider:  Nicki Reaper, NP  History of Present Illness: Jeff Bray is a 42 y.o. male referred to [place] for evaluation of cholelithiasis.  The patient reports he's had a long standing history of cholelithiasis, with a first episode of biliary colic in 2012.  He reports he was jaundiced but denies any ERCP done.  He was discharged to home at the time and was told by a surgeon that he needed a cholecystectomy but he decided to pursue conservative management.  He altered his diet and has been having relative success, with only milder and intermittent episodes of right upper quadrant pain.  He reports that eating cheese does trigger the pain at times.  Denies any nausea or vomiting during any episode.  Denies any fevers or chills, chest pain, shortness of breath.  The episodes are self-limiting and he reports that drinking apple juice helps with the pain.   He has since 2012 been hesitant about surgery but he recently lost his job and is losing his insurance by the end of next month.  He is coming today to talk about possible surgery.  Past Medical History: --Cholelithiasis  Past Surgical History: --None  Home Medications: Prior to Admission medications   Not on File    Allergies: No Known Allergies  Social History:  reports that he has quit smoking. He has never used smokeless tobacco. He reports that he drinks about 3.6 oz of alcohol per week. He reports that he does not use drugs.   Family History: Family History  Problem Relation Age of Onset  . Melanoma Mother   . Bladder Cancer Father   . Diabetes Neg Hx   . Hypertension Neg Hx   . Hyperlipidemia Neg Hx   . Stroke Neg Hx     Review of Systems: Review of Systems  Constitutional: Negative for chills and fever.  HENT: Negative for hearing loss.   Respiratory: Negative for shortness of breath.   Cardiovascular: Negative for chest pain.  Gastrointestinal: Positive for  abdominal pain and nausea. Negative for constipation, diarrhea and vomiting.  Genitourinary: Negative for dysuria.  Musculoskeletal: Negative for myalgias.  Skin: Negative for rash.  Neurological: Negative for dizziness.  Psychiatric/Behavioral: Negative for depression.    Physical Exam BP (!) 156/85   Pulse 91   Temp 97.8 F (36.6 C) (Oral)   Ht 6' 2.25" (1.886 m)   Wt 261 lb (118.4 kg)   BMI 33.29 kg/m  CONSTITUTIONAL: No acute distress HEENT:  Normocephalic, atraumatic, extraocular motion intact. NECK: Trachea is midline, and there is no jugular venous distension. RESPIRATORY:  Lungs are clear, and breath sounds are equal bilaterally. Normal respiratory effort without pathologic use of accessory muscles. CARDIOVASCULAR: Heart is regular without murmurs, gallops, or rubs. GI: The abdomen is soft, non-distended, currently non-tender to palpation. There were no palpable masses.  MUSCULOSKELETAL:  Normal muscle strength and tone in all four extremities.  No peripheral edema or cyanosis. SKIN: Skin turgor is normal. There are no pathologic skin lesions.  NEUROLOGIC:  Motor and sensation is grossly normal.  Cranial nerves are grossly intact. PSYCH:  Alert and oriented to person, place and time. Affect is normal.  Laboratory Analysis: Labs from 01/05/18: WBC 6.2, Hgb 16.6, Hct 47.6, Plt 240.  Na 138, K 4.1, Cl 103, CO2 30, BUN 14, Cr 0.87.  Tbili 1.3, AST 19, ALT 22, AP 48.  Imaging: U/S 01/05/18: IMPRESSION: Gallstones without sonographic evidence of acute cholecystitis.  Normal appearing common bile duct.  Assessment and Plan: This is a 42 y.o. male who presents with a history of cholelithiasis.  I have independently viewed the patient's imaging study and reviewed his laboratory studies.  Overall, his U/S is unremarkable without any cholecystitis changes, but does have cholelithiasis.  His labs are overall normal with just a very mild increase in total bilirubin to 1.3.  Discussed  with the patient the function of the gallbladder, the symptoms of biliary colic, and the risk that he has of having future episodes of biliary colic, and possibly an episode of acute cholecystitis.  Given his symptoms, it would be reasonable to do a cholecystectomy, but the patient is currently hesitant.  It is also reasonable to continue watchful waiting and continue a low fat diet.  He does not endorse the episodes being too severe.  Discussed the post-op outcomes and restrictions and expectations with the patient, as well as risk of bleeding, infection, and injury to surrounding structures.  Currently he wants to think about his options and he will then call us back to schedule surgery if he's interested.  I informed him that we'd be happy to help if he decides for surgery in the future.  Face-to-face time spent with the patient and care providers was 60 minutes, with more than 50% of the time spent counseling, educating, and coordinating care of the patient.     [name], MD [facility]

## 2018-01-06 NOTE — Patient Instructions (Signed)
Please give a call in case you have any questions or concerns.

## 2018-01-08 LAB — HSV(HERPES SIMPLEX VRS) I + II AB-IGG
HAV 1 IGG,TYPE SPECIFIC AB: 44.3 index — ABNORMAL HIGH
HSV 2 IGG,TYPE SPECIFIC AB: <0.9 index

## 2018-01-08 LAB — HSV 1/2 AB (IGM), IFA W/RFLX TITER
HSV 1 IGM SCREEN: NEGATIVE
HSV 2 IgM Screen: NEGATIVE

## 2018-02-17 ENCOUNTER — Telehealth: Payer: Self-pay | Admitting: *Deleted

## 2018-02-17 NOTE — Telephone Encounter (Signed)
Copied from CRM [id number]. Topic: Referral - Request >> Feb 17, 2018 10:51 AM Clack, Princella Pellegrini wrote: Reason for CRM: Pt would like a referral to have a sleep study done. Also pt states his insurance ends the end of this month and would like to have it done before then.

## 2018-02-17 NOTE — Telephone Encounter (Signed)
He will need an appt. We have not discussed his concerns for sleep study, and I have to measure his neck, get an Epworth Sleepiness Scale. Pulm will want notes along with referral.

## 2018-02-18 NOTE — Telephone Encounter (Signed)
Left message on voicemail.

## 2018-09-30 ENCOUNTER — Encounter: Payer: BLUE CROSS/BLUE SHIELD | Admitting: Internal Medicine

## 2018-10-01 ENCOUNTER — Ambulatory Visit (INDEPENDENT_AMBULATORY_CARE_PROVIDER_SITE_OTHER): Payer: 59 | Admitting: Internal Medicine

## 2018-10-01 ENCOUNTER — Encounter: Payer: Self-pay | Admitting: Internal Medicine

## 2018-10-01 VITALS — BP 144/90 | HR 78 | Temp 98.1°F | Ht 74.0 in | Wt 264.0 lb

## 2018-10-01 DIAGNOSIS — I10 Essential (primary) hypertension: Secondary | ICD-10-CM

## 2018-10-01 DIAGNOSIS — Z113 Encounter for screening for infections with a predominantly sexual mode of transmission: Secondary | ICD-10-CM | POA: Diagnosis not present

## 2018-10-01 DIAGNOSIS — Z0000 Encounter for general adult medical examination without abnormal findings: Secondary | ICD-10-CM | POA: Diagnosis not present

## 2018-10-01 NOTE — Progress Notes (Signed)
Subjective:    Patient ID: Jeff Bray, male    DOB: 01/26/1976, 43 y.o.   MRN: [id number]  HPI  Pt presents to the clinic today for his annual exam.  HTN: His BP today is 144/90. He is not taking any antihypertensives at this time. There is no ECG on file.   Flu: never Tetanus: 06/2017 Vision Screening: as needed Dentist: as needed  Diet: He does not eat meat. He consumes fruits and veggies daily. He does not eat fried foods. He drinks mostly water, hot caffenated tea. Exercise: Walking  Review of Systems      No past medical history on file.  No current outpatient medications on file.   No current facility-administered medications for this visit.     No Known Allergies  Family History  Problem Relation Age of Onset  . Melanoma Mother   . Bladder Cancer Father   . Diabetes Neg Hx   . Hypertension Neg Hx   . Hyperlipidemia Neg Hx   . Stroke Neg Hx     Social History   Socioeconomic History  . Marital status: Married    Spouse name: Not on file  . Number of children: Not on file  . Years of education: Not on file  . Highest education level: Not on file  Occupational History  . Not on file  Social Needs  . Financial resource strain: Not on file  . Food insecurity:    Worry: Not on file    Inability: Not on file  . Transportation needs:    Medical: Not on file    Non-medical: Not on file  Tobacco Use  . Smoking status: Former Games developer  . Smokeless tobacco: Never Used  Substance and Sexual Activity  . Alcohol use: Yes    Alcohol/week: 6.0 standard drinks    Types: 6 Glasses of wine per week    Comment: occasional  . Drug use: No  . Sexual activity: Yes  Lifestyle  . Physical activity:    Days per week: Not on file    Minutes per session: Not on file  . Stress: Not on file  Relationships  . Social connections:    Talks on phone: Not on file    Gets together: Not on file    Attends religious service: Not on file    Active member of club or  organization: Not on file    Attends meetings of clubs or organizations: Not on file    Relationship status: Not on file  . Intimate partner violence:    Fear of current or ex partner: Not on file    Emotionally abused: Not on file    Physically abused: Not on file    Forced sexual activity: Not on file  Other Topics Concern  . Not on file  Social History Narrative  . Not on file     Constitutional: Denies fever, malaise, fatigue, headache or abrupt weight changes.  HEENT: Denies eye pain, eye redness, ear pain, ringing in the ears, wax buildup, runny nose, nasal congestion, bloody nose, or sore throat. Respiratory: Denies difficulty breathing, shortness of breath, cough or sputum production.   Cardiovascular: Denies chest pain, chest tightness, palpitations or swelling in the hands or feet.  Gastrointestinal: Pt reports intermittent reflux. Denies abdominal pain, bloating, constipation, diarrhea or blood in the stool.  GU: Denies urgency, frequency, pain with urination, burning sensation, blood in urine, odor or discharge. Musculoskeletal: Denies decrease in range of motion, difficulty with gait,  muscle pain or joint pain and swelling.  Skin: Denies redness, rashes, lesions or ulcercations.  Neurological: Pt reports numbness in feet. Denies dizziness, difficulty with memory, difficulty with speech or problems with balance and coordination.  Psych: Denies anxiety, depression, SI/HI.  No other specific complaints in a complete review of systems (except as listed in HPI above).  Objective:   Physical Exam   BP (!) 144/90   Pulse 78   Temp 98.1 F (36.7 C) (Oral)   Ht 6\' 2"  (1.88 m)   Wt 264 lb (119.7 kg)   SpO2 98%   BMI 33.90 kg/m  Wt Readings from Last 3 Encounters:  10/01/18 264 lb (119.7 kg)  01/06/18 261 lb (118.4 kg)  01/05/18 261 lb (118.4 kg)    General: Appears his stated age, obese in NAD. Skin: Warm, dry and intact.  HEENT: Head: normal shape and size; Eyes:  sclera white, no icterus, conjunctiva pink, PERRLA and EOMs intact; Ears: Tm's gray and intact, normal light reflex; Throat/Mouth: Teeth present, mucosa pink and moist, no exudate, lesions or ulcerations noted.  Neck:  Neck supple, trachea midline. No masses, lumps or thyromegaly present.  Cardiovascular: Normal rate and rhythm. S1,S2 noted.  No murmur, rubs or gallops noted. No JVD or BLE edema.  Pulmonary/Chest: Normal effort and positive vesicular breath sounds. No respiratory distress. No wheezes, rales or ronchi noted.  Abdomen: Soft and nontender. Normal bowel sounds. No distention or masses noted. Liver, spleen and kidneys non palpable. Musculoskeletal: Strength 5/5 BUE/BLE. No difficulty with gait.  Neurological: Alert and oriented. Cranial nerves II-XII grossly intact. Coordination normal.  Psychiatric: Mood and affect normal. Behavior is normal. Judgment and thought content normal.     BMET    Component Value Date/Time   NA 138 01/05/2018 0924   K 4.1 01/05/2018 0924   CL 103 01/05/2018 0924   CO2 30 01/05/2018 0924   GLUCOSE 100 (H) 01/05/2018 0924   BUN 14 01/05/2018 0924   CREATININE 0.87 01/05/2018 0924   CALCIUM 9.4 01/05/2018 0924    Lipid Panel     Component Value Date/Time   CHOL 187 01/05/2018 0924   TRIG 116.0 01/05/2018 0924   HDL 39.90 01/05/2018 0924   CHOLHDL 5 01/05/2018 0924   VLDL 23.2 01/05/2018 0924   LDLCALC 124 (H) 01/05/2018 0924    CBC    Component Value Date/Time   WBC 6.2 01/05/2018 0924   RBC 5.36 01/05/2018 0924   HGB 16.6 01/05/2018 0924   HCT 47.6 01/05/2018 0924   PLT 240.0 01/05/2018 0924   MCV 88.9 01/05/2018 0924   MCHC 34.8 01/05/2018 0924   RDW 13.2 01/05/2018 0924    Hgb A1C Lab Results  Component Value Date   HGBA1C 5.4 01/05/2018          Assessment & Plan:   Preventative Health Maintenance:  He declines flu shot Tetanus UTD Encouraged him to consume a balanced diet and exercise regimen Advised him to see  an eye doctor and dentist annually Will check CBC, CMET, Lipid, HIV and RPR Swabbed penis for gonorrhea, chlamydia, trich  RTC in 1 year, sooner if needed Nicki Reaper, NP

## 2018-10-01 NOTE — Patient Instructions (Signed)

## 2018-10-01 NOTE — Addendum Note (Signed)
Addended by: Alvina Chou on: 10/01/2018 04:09 PM   Modules accepted: Orders

## 2018-10-01 NOTE — Assessment & Plan Note (Signed)
Uncontrolled but refuses medication Discussed long term risk of heart attack, stroke and death Reinforced DASH diet and exercise for weight loss

## 2018-10-04 LAB — COMPREHENSIVE METABOLIC PANEL
AG Ratio: 1.7 (calc) (ref 1.0–2.5)
ALKALINE PHOSPHATASE (APISO): 48 U/L (ref 40–115)
ALT: 17 U/L (ref 9–46)
AST: 16 U/L (ref 10–40)
Albumin: 4.1 g/dL (ref 3.6–5.1)
BILIRUBIN TOTAL: 0.5 mg/dL (ref 0.2–1.2)
BUN: 12 mg/dL (ref 7–25)
CO2: 27 mmol/L (ref 20–32)
Calcium: 9.4 mg/dL (ref 8.6–10.3)
Chloride: 101 mmol/L (ref 98–110)
Creat: 0.81 mg/dL (ref 0.60–1.35)
Globulin: 2.4 g/dL (calc) (ref 1.9–3.7)
Glucose, Bld: 85 mg/dL (ref 65–99)
Potassium: 4.2 mmol/L (ref 3.5–5.3)
Sodium: 137 mmol/L (ref 135–146)
Total Protein: 6.5 g/dL (ref 6.1–8.1)

## 2018-10-04 LAB — LIPID PANEL
CHOLESTEROL: 182 mg/dL (ref <200)
HDL: 41 mg/dL (ref >40)
LDL Cholesterol (Calc): 99 mg/dL (calc)
Non-HDL Cholesterol (Calc): 141 mg/dL (calc) — ABNORMAL HIGH (ref <130)
Total CHOL/HDL Ratio: 4.4 (calc) (ref <5.0)
Triglycerides: 315 mg/dL — ABNORMAL HIGH (ref <150)

## 2018-10-04 LAB — CBC
HCT: 47.3 % (ref 38.5–50.0)
Hemoglobin: 16.5 g/dL (ref 13.2–17.1)
MCH: 30.7 pg (ref 27.0–33.0)
MCHC: 34.9 g/dL (ref 32.0–36.0)
MCV: 87.9 fL (ref 80.0–100.0)
MPV: 9.9 fL (ref 7.5–12.5)
Platelets: 260 10*3/uL (ref 140–400)
RBC: 5.38 10*6/uL (ref 4.20–5.80)
RDW: 12.9 % (ref 11.0–15.0)
WBC: 9.3 10*3/uL (ref 3.8–10.8)

## 2018-10-04 LAB — C. TRACHOMATIS/N. GONORRHOEAE RNA
C. trachomatis RNA, TMA: NOT DETECTED
N. gonorrhoeae RNA, TMA: NOT DETECTED

## 2018-10-04 LAB — RPR: RPR Ser Ql: NONREACTIVE

## 2018-10-04 LAB — HEMOGLOBIN A1C
Hgb A1c MFr Bld: 5.3 % of total Hgb (ref <5.7)
Mean Plasma Glucose: 105 (calc)
eAG (mmol/L): 5.8 (calc)

## 2018-10-04 LAB — HIV ANTIBODY (ROUTINE TESTING W REFLEX): HIV 1&2 Ab, 4th Generation: NONREACTIVE

## 2018-10-04 LAB — TRICHOMONAS VAGINALIS RNA, QL,MALES: Trichomonas vaginalis RNA: NOT DETECTED

## 2019-08-18 ENCOUNTER — Encounter: Payer: Self-pay | Admitting: Internal Medicine

## 2019-08-18 ENCOUNTER — Ambulatory Visit (INDEPENDENT_AMBULATORY_CARE_PROVIDER_SITE_OTHER): Payer: 59 | Admitting: Internal Medicine

## 2019-08-18 ENCOUNTER — Other Ambulatory Visit: Payer: Self-pay

## 2019-08-18 VITALS — BP 136/84 | HR 84 | Temp 97.6°F | Wt 268.0 lb

## 2019-08-18 DIAGNOSIS — R0681 Apnea, not elsewhere classified: Secondary | ICD-10-CM | POA: Diagnosis not present

## 2019-08-18 DIAGNOSIS — E6609 Other obesity due to excess calories: Secondary | ICD-10-CM

## 2019-08-18 DIAGNOSIS — R0683 Snoring: Secondary | ICD-10-CM

## 2019-08-18 DIAGNOSIS — Z6833 Body mass index (BMI) 33.0-33.9, adult: Secondary | ICD-10-CM

## 2019-08-18 DIAGNOSIS — Z113 Encounter for screening for infections with a predominantly sexual mode of transmission: Secondary | ICD-10-CM

## 2019-08-18 DIAGNOSIS — Z114 Encounter for screening for human immunodeficiency virus [HIV]: Secondary | ICD-10-CM | POA: Diagnosis not present

## 2019-08-18 DIAGNOSIS — Z20822 Contact with and (suspected) exposure to covid-19: Secondary | ICD-10-CM

## 2019-08-18 NOTE — Patient Instructions (Signed)

## 2019-08-18 NOTE — Progress Notes (Signed)
Subjective:    Patient ID: Jeff Bray, male    DOB: 01-14-1976, 43 y.o.   MRN: [id number]  HPI  Pt presents to the clinic today with multiple concerns.   He is concerned about sleep apnea. Pt reports that he has been told he snores and stops breathing at times. He is able to fall asleep but unable to stay asleep. He reports that gets up a lot at night about 6-8 times, sometimes to urinate. He does snore. He states that his father would snore and pause in his breathing as well, but never diagnosed with sleep apnea.    STD: Would like to get STD screening but is not concerned about anything in specific. He has not noticed discharge, urgency, frequency or hematuria.  He reports that he is not in a monogomous relationship and uses protection during sexual intercourse most of the time.  Review of Systems      No past medical history on file.  No current outpatient medications on file.   No current facility-administered medications for this visit.     No Known Allergies  Family History  Problem Relation Age of Onset  . Melanoma Mother   . Bladder Cancer Father   . Diabetes Neg Hx   . Hypertension Neg Hx   . Hyperlipidemia Neg Hx   . Stroke Neg Hx     Social History   Socioeconomic History  . Marital status: Married    Spouse name: Not on file  . Number of children: Not on file  . Years of education: Not on file  . Highest education level: Not on file  Occupational History  . Not on file  Social Needs  . Financial resource strain: Not on file  . Food insecurity    Worry: Not on file    Inability: Not on file  . Transportation needs    Medical: Not on file    Non-medical: Not on file  Tobacco Use  . Smoking status: Former Games developer  . Smokeless tobacco: Never Used  Substance and Sexual Activity  . Alcohol use: Yes    Alcohol/week: 6.0 standard drinks    Types: 6 Glasses of wine per week    Comment: occasional  . Drug use: No  . Sexual activity: Yes  Lifestyle  .  Physical activity    Days per week: Not on file    Minutes per session: Not on file  . Stress: Not on file  Relationships  . Social Musician on phone: Not on file    Gets together: Not on file    Attends religious service: Not on file    Active member of club or organization: Not on file    Attends meetings of clubs or organizations: Not on file    Relationship status: Not on file  . Intimate partner violence    Fear of current or ex partner: Not on file    Emotionally abused: Not on file    Physically abused: Not on file    Forced sexual activity: Not on file  Other Topics Concern  . Not on file  Social History Narrative  . Not on file     Constitutional: Denies fever, malaise, fatigue, headache or abrupt weight changes.  Respiratory: pt reports snoring and apnea during sleep. Denies cough or sputum production.  Cardiovascular: Denies chest pain, chest tightness, palpitations or swelling in the hands or feet.  Gastrointestinal: Denies abdominal pain, bloating, constipation, diarrhea or blood  in the stool.  GU: Denies urgency, frequency, pain with urination, burning sensation, blood in urine, odor or discharge. Skin: Denies redness, lesions or ulcercations.   No other specific complaints in a complete review of systems (except as listed in HPI above).  Objective:   Physical Exam  BP 136/84   Pulse 84   Temp 97.6 F (36.4 C) (Temporal)   Wt 268 lb (121.6 kg)   SpO2 98%   BMI 34.41 kg/m   Wt Readings from Last 3 Encounters:  10/01/18 264 lb (119.7 kg)  01/06/18 261 lb (118.4 kg)  01/05/18 261 lb (118.4 kg)    General: Appears his stated age, obese in NAD. Skin: Warm, dry and intact. No penile rashes, lesions or ulcerations noted. Cardiovascular: Normal rate and rhythm. S1,S2 noted.  No murmur, rubs or gallops noted.  Pulmonary/Chest: Normal effort and positive vesicular breath sounds. No respiratory distress. No wheezes, rales or ronchi noted.  Abdomen:  Soft and nontender. Normal bowel sounds.    BMET    Component Value Date/Time   NA 137 10/01/2018 1543   K 4.2 10/01/2018 1543   CL 101 10/01/2018 1543   CO2 27 10/01/2018 1543   GLUCOSE 85 10/01/2018 1543   BUN 12 10/01/2018 1543   CREATININE 0.81 10/01/2018 1543   CALCIUM 9.4 10/01/2018 1543    Lipid Panel     Component Value Date/Time   CHOL 182 10/01/2018 1543   TRIG 315 (H) 10/01/2018 1543   HDL 41 10/01/2018 1543   CHOLHDL 4.4 10/01/2018 1543   VLDL 23.2 01/05/2018 0924   LDLCALC 99 10/01/2018 1543    CBC    Component Value Date/Time   WBC 9.3 10/01/2018 1543   RBC 5.38 10/01/2018 1543   HGB 16.5 10/01/2018 1543   HCT 47.3 10/01/2018 1543   PLT 260 10/01/2018 1543   MCV 87.9 10/01/2018 1543   MCH 30.7 10/01/2018 1543   MCHC 34.9 10/01/2018 1543   RDW 12.9 10/01/2018 1543    Hgb A1C Lab Results  Component Value Date   HGBA1C 5.3 10/01/2018            Assessment & Plan:  Snoring, Apneic Episodes, Obesity:  Epworth scale today is 16. Neck measuring 16". Patient has BMI of 33.  Will refer to pulm for sleep study  Screen for STD:  Will check HIV, RPR and Hep C today Will check urine gonorrhea, chlamydia and trich  Will follow up after labs are back, return precautions discussed Webb Silversmith, NP

## 2019-08-19 LAB — RPR: RPR Ser Ql: NONREACTIVE

## 2019-08-19 LAB — C. TRACHOMATIS/N. GONORRHOEAE RNA
C. trachomatis RNA, TMA: NOT DETECTED
N. gonorrhoeae RNA, TMA: NOT DETECTED

## 2019-08-19 LAB — HIV ANTIBODY (ROUTINE TESTING W REFLEX): HIV 1&2 Ab, 4th Generation: NONREACTIVE

## 2019-08-19 LAB — TRICHOMONAS VAGINALIS RNA, QL,MALES: Trichomonas vaginalis RNA: NOT DETECTED

## 2019-08-19 LAB — HEPATITIS C ANTIBODY
Hepatitis C Ab: NONREACTIVE
SIGNAL TO CUT-OFF: 0.02 (ref <1.00)

## 2019-08-22 LAB — NOVEL CORONAVIRUS, NAA: SARS-CoV-2, NAA: NOT DETECTED

## 2019-08-29 ENCOUNTER — Other Ambulatory Visit: Payer: Self-pay

## 2019-08-29 DIAGNOSIS — Z20822 Contact with and (suspected) exposure to covid-19: Secondary | ICD-10-CM

## 2019-08-30 LAB — NOVEL CORONAVIRUS, NAA: SARS-CoV-2, NAA: NOT DETECTED

## 2019-09-20 ENCOUNTER — Other Ambulatory Visit: Payer: Self-pay

## 2019-09-20 ENCOUNTER — Encounter: Payer: Self-pay | Admitting: Pulmonary Disease

## 2019-09-20 ENCOUNTER — Ambulatory Visit (INDEPENDENT_AMBULATORY_CARE_PROVIDER_SITE_OTHER): Payer: 59 | Admitting: Pulmonary Disease

## 2019-09-20 VITALS — BP 138/90 | HR 87 | Temp 97.2°F | Ht 74.0 in | Wt 268.4 lb

## 2019-09-20 DIAGNOSIS — R0683 Snoring: Secondary | ICD-10-CM

## 2019-09-20 NOTE — Progress Notes (Signed)
[facility]  Chief Complaint  Patient presents with  . Consult    Patient states that per his parents and girlfriend he stops breathing at night and it happens several times a night. Patient is also has loud snoring. Denies trouble sleeping. Has not had sleep study    Constitutional:  BP 138/90 (BP Location: Right Arm, Patient Position: Sitting, Cuff Size: Normal)   Pulse 87   Temp (!) 97.2 F (36.2 C) (Temporal)   Ht 6\' 2"  (1.88 m)   Wt 268 lb 6.4 oz (121.7 kg)   SpO2 97% Comment: on Ra  BMI 34.46 kg/m   Past Medical History:  Vitamin D deficiency, Cholelithiasis  Brief Summary:  Jeff Bray is a 43 y.o. male with snoring.  He has snored while asleep as long as he can remember.  His girlfriend has told him he stops breathing while asleep, and this has become more frequent.  He is a restless sleeper and wakes up several times during the night.  He will nap during the day when he is on break at work.  He goes to sleep at 11 pm.  He falls asleep in 30 minutes.  He wakes up at several times to use the bathroom.  He gets out of bed at 750 am.  He feels denies in the morning.  He denies morning headache.  He does not use anything to help him fall sleep.  He drinks black tea during the day.  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  He denies sleep walking, sleep talking, or nightmares.  There is no history of restless legs.  He denies sleep hallucinations, sleep paralysis, or cataplexy.  The Epworth score is 15 out of 24.    Physical Exam:   Appearance - well kempt   ENMT - clear nasal mucosa, midline nasal  septum, no oral exudates, no LAN, trachea midline, high arched palate, elongated uvula, 2+ tonsils, MP3  Respiratory - normal chest wall, normal respiratory effort, no accessory muscle use, no wheeze/rales  CV - s1s2 regular rate and rhythm, no murmurs, no peripheral edema, radial pulses symmetric  GI - soft, non  tender, no masses  Lymph - no adenopathy noted in neck and axillary areas  MSK - normal gait  Ext - no cyanosis, clubbing, or joint inflammation noted  Skin - no rashes, lesions, or ulcers  Neuro - normal strength, oriented x 3  Psych - normal mood and affect  Discussion:  He has snoring, sleep disruption, witnessed apnea and daytime sleepiness.  I am concerned he could have obstructive sleep apnea.  Assessment/Plan:   Snoring with excessive daytime sleepiness. - will need to arrange for a home sleep study  Obesity. - discussed how weight can impact sleep and risk for sleep disordered breathing - discussed options to assist with weight loss: combination of diet modification, cardiovascular and strength training exercises  Cardiovascular risk. - had an extensive discussion regarding the adverse health consequences related to untreated sleep disordered breathing - specifically discussed the risks for hypertension, coronary artery disease, cardiac dysrhythmias, cerebrovascular disease, and diabetes - lifestyle modification discussed  Safe driving practices. - discussed how sleep disruption can increase risk of accidents, particularly when driving - safe driving practices were discussed  Therapies for obstructive sleep apnea. - if the sleep study shows significant sleep apnea, then various therapies for treatment were reviewed: CPAP, oral appliance, and surgical interventions  Patient Instructions  Will arrange for home sleep study Will call to arrange for follow up after sleep study reviewed     Coralyn Helling, MD [facility] Pager: [phone] 09/20/2019, 10:46 AM  Flow Sheet      Sleep tests:    Review of Systems:  Reflux.  Remainder reviewed and negative.  Medications:   Allergies as of 09/20/2019   No Known Allergies     Medication List       Accurate as of September 20, 2019 10:46 AM. If you have any questions, ask your  nurse or doctor.        vitamin C 1000 MG tablet Take 1,000 mg by mouth daily.   Vitamin D3 125 MCG (5000 UT) Caps Take by mouth.   vitamin k 100 MCG tablet Take 100 mcg by mouth daily.       Past Surgical History:  He has not had any prior surgeries  Family History:  His family history includes Bladder Cancer in his father; Melanoma in his mother.  Social History:  He  reports that he has quit smoking. He has never used smokeless tobacco. He reports current alcohol use of about 6.0 standard drinks of alcohol per week. He reports that he does not use drugs.

## 2019-09-20 NOTE — Patient Instructions (Signed)
Will arrange for home sleep study Will call to arrange for follow up after sleep study reviewed  

## 2019-11-28 ENCOUNTER — Ambulatory Visit: Payer: 59 | Admitting: Surgery

## 2019-11-28 ENCOUNTER — Encounter: Payer: Self-pay | Admitting: Surgery

## 2019-11-28 ENCOUNTER — Telehealth: Payer: Self-pay | Admitting: Surgery

## 2019-11-28 ENCOUNTER — Other Ambulatory Visit: Payer: Self-pay

## 2019-11-28 VITALS — BP 130/80 | HR 97 | Temp 98.1°F | Resp 14 | Ht 74.0 in | Wt 265.0 lb

## 2019-11-28 DIAGNOSIS — K8020 Calculus of gallbladder without cholecystitis without obstruction: Secondary | ICD-10-CM | POA: Diagnosis not present

## 2019-11-28 NOTE — Progress Notes (Signed)
11/28/2019  History of Present Illness: Jeff Bray is a 44 y.o. male previously seen on 01/06/2018 for symptomatic cholelithiasis.  He has been having issues since 2012 but after further discussion with him, he opted for conservative management.  He has been doing initially well but then over the winter and particularly since this year he has been having more constant and frequent episodes of right upper quadrant pain.  He denies any other symptoms associated with this such as nausea or vomiting and reports only pain mostly in the front portion of the right upper quadrant with only sometimes radiation to the back.  He has tried changing his diet and even started a vegan diet earlier this year but only worked temporarily.  Denies having any fevers, chills, chest pain, shortness of breath.  However he does report having loose stools.  Past Medical History: Past Medical History:  Diagnosis Date  . Cholelithiasis      Past Surgical History: -None  Home Medications: Prior to Admission medications   Medication Sig Start Date End Date Taking? Authorizing Provider  Ascorbic Acid (VITAMIN C) 1000 MG tablet Take 1,000 mg by mouth daily.   Yes [provider]  Cholecalciferol (VITAMIN D3) 125 MCG (5000 UT) CAPS Take by mouth.   Yes [provider]  Multiple Vitamins-Minerals (DAILY HEART HEALTH SUPPORT PO) Take by mouth daily.   Yes [provider]  NON FORMULARY flucome-immune support   Yes [provider]  vitamin k 100 MCG tablet Take 100 mcg by mouth daily.   Yes [provider]    Allergies: No Known Allergies  Review of Systems: Review of Systems  Constitutional: Negative for chills and fever.  Respiratory: Negative for shortness of breath.   Cardiovascular: Negative for chest pain.  Gastrointestinal: Positive for abdominal pain and diarrhea. Negative for constipation, nausea and vomiting.    Physical Exam BP 130/80   Pulse 97   Temp 98.1  F (36.7 C)   Resp 14   Ht 6\' 2"  (1.88 m)   Wt 265 lb (120.2 kg)   SpO2 95%   BMI 34.02 kg/m  CONSTITUTIONAL: No acute distress HEENT:  Normocephalic, atraumatic, extraocular motion intact. RESPIRATORY:  Lungs are clear, and breath sounds are equal bilaterally. Normal respiratory effort without pathologic use of accessory muscles. CARDIOVASCULAR: Heart is regular without murmurs, gallops, or rubs. GI: The abdomen is soft, nondistended, with mild tenderness in the right upper quadrant that he qualifies as 5 or 6 out of 10.  NEUROLOGIC:  Motor and sensation is grossly normal.  Cranial nerves are grossly intact. PSYCH:  Alert and oriented to person, place and time. Affect is normal.  Labs/Imaging: No recent labs or imaging in our system  Assessment and Plan: This is a 44 y.o. male with symptomatic cholelithiasis with failed conservative management.  -Discussed with the patient that since conservative management is no longer working or helping him, we should proceed to surgical management.  He is in agreement.  Discussed with him the role for laparoscopic cholecystectomy, particularly discussing the case at length including the risks of bleeding, infection, injury to surrounding structures.  Discussed with him postoperative recovery, time off from work, as well as activity restrictions of no heavy lifting or pushing of no more than 10 to 15 pounds for period of 4 weeks.  Patient understands all these instructions and he is willing to proceed.  He understands also that he would need a Covid test prior to surgery. -We will schedule the  patient for surgery on 12/07/2019.  Face-to-face time spent with the patient and care providers was 25 minutes, with more than 50% of the time spent counseling, educating, and coordinating care of the patient.     Howie Ill, MD [facility]

## 2019-11-28 NOTE — Patient Instructions (Addendum)
You have requested to have your gallbladder removed. This will be done at [facility] with Dr.Piscoya.  Our surgery scheduler will call you in the next 1-2 days to go over surgery dates, and Covid testing information. Please see the (blue)pre-care form that you have been given today  You will most likely be out of work 1-2 weeks for this surgery. You will return after your post-op appointment with a lifting restriction for approximately 4 more weeks.  You will be able to eat anything you would like to following surgery. But, start by eating a bland diet and advance this as tolerated. The Gallbladder diet is below, please go as closely by this diet as possible prior to surgery to avoid any further attacks.  If you have any questions, please call our office at [phone].  Laparoscopic Cholecystectomy Laparoscopic cholecystectomy is surgery to remove the gallbladder. The gallbladder is located in the upper right part of the abdomen, behind the liver. It is a storage sac for bile, which is produced in the liver. Bile aids in the digestion and absorption of fats. Cholecystectomy is often done for inflammation of the gallbladder (cholecystitis). This condition is usually caused by a buildup of gallstones (cholelithiasis) in the gallbladder. Gallstones can block the flow of bile, and that can result in inflammation and pain. In severe cases, emergency surgery may be required. If emergency surgery is not required, you will have time to prepare for the procedure. Laparoscopic surgery is an alternative to open surgery. Laparoscopic surgery has a shorter recovery time. Your common bile duct may also need to be examined during the procedure. If stones are found in the common bile duct, they may be removed. LET [facility] CARE PROVIDER KNOW ABOUT:  Any allergies you have.  All medicines you are taking, including vitamins, herbs, eye drops, creams, and over-the-counter medicines.  Previous problems  you or members of your family have had with the use of anesthetics.  Any blood disorders you have.  Previous surgeries you have had.    Any medical conditions you have. RISKS AND COMPLICATIONS Generally, this is a safe procedure. However, problems may occur, including:  Infection.  Bleeding.  Allergic reactions to medicines.  Damage to other structures or organs.  A stone remaining in the common bile duct.  A bile leak from the cyst duct that is clipped when your gallbladder is removed.  The need to convert to open surgery, which requires a larger incision in the abdomen. This may be necessary if your surgeon thinks that it is not safe to continue with a laparoscopic procedure. BEFORE THE PROCEDURE  Ask your health care provider about:  Changing or stopping your regular medicines. This is especially important if you are taking diabetes medicines or blood thinners.  Taking medicines such as aspirin and ibuprofen. These medicines can thin your blood. Do not take these medicines before your procedure if your health care provider instructs you not to.  Follow instructions from your health care provider about eating or drinking restrictions.  Let your health care provider know if you develop a cold or an infection before surgery.  Plan to have someone take you home after the procedure.  Ask your health care provider how your surgical site will be marked or identified.  You may be given antibiotic medicine to help prevent infection. PROCEDURE  To reduce your risk of infection:  Your health care team will wash or sanitize their hands.  Your skin will be washed with soap.  An IV tube may be inserted into one of your veins.  You will be given a medicine to make you fall asleep (general anesthetic).  A breathing tube will be placed in your mouth.  The surgeon will make several small cuts (incisions) in your abdomen.  A thin, lighted tube (laparoscope) that has a tiny  camera on the end will be inserted through one of the small incisions. The camera on the laparoscope will send a picture to a TV screen (monitor) in the operating room. This will give the surgeon a good view inside your abdomen.  A gas will be pumped into your abdomen. This will expand your abdomen to give the surgeon more room to perform the surgery.  Other tools that are needed for the procedure will be inserted through the other incisions. The gallbladder will be removed through one of the incisions.  After your gallbladder has been removed, the incisions will be closed with stitches (sutures), staples, or skin glue.  Your incisions may be covered with a bandage (dressing). The procedure may vary among health care providers and hospitals. AFTER THE PROCEDURE  Your blood pressure, heart rate, breathing rate, and blood oxygen level will be monitored often until the medicines you were given have worn off.  You will be given medicines as needed to control your pain.   This information is not intended to replace advice given to you by your health care provider. Make sure you discuss any questions you have with your health care provider.   Document Released: 09/15/2005 Document Revised: 06/06/2015 Document Reviewed: 04/27/2013 Elsevier Interactive Patient Education 2016 [place] Diet for Gallbladder Conditions A low-fat diet can be helpful if you have pancreatitis or a gallbladder condition. With these conditions, your pancreas and gallbladder have trouble digesting fats. A healthy eating plan with less fat will help rest your pancreas and gallbladder and reduce your symptoms. WHAT DO I NEED TO KNOW ABOUT THIS DIET?  Eat a low-fat diet.  Reduce your fat intake to less than 20-30% of your total daily calories. This is less than 50-60 g of fat per day.  Remember that you need some fat in your diet. Ask your dietician what your daily goal should be.  Choose nonfat and  low-fat healthy foods. Look for the words "nonfat," "low fat," or "fat free."  As a guide, look on the label and choose foods with less than 3 g of fat per serving. Eat only one serving.  Avoid alcohol.  Do not smoke. If you need help quitting, talk with your health care provider.  Eat small frequent meals instead of three large heavy meals. WHAT FOODS CAN I EAT? Grains Include healthy grains and starches such as potatoes, wheat bread, fiber-rich cereal, and brown rice. Choose whole grain options whenever possible. In adults, whole grains should account for 45-65% of your daily calories.  Fruits and Vegetables Eat plenty of fruits and vegetables. Fresh fruits and vegetables add fiber to your diet. Meats and Other Protein Sources Eat lean meat such as chicken and pork. Trim any fat off of meat before cooking it. Eggs, fish, and beans are other sources of protein. In adults, these foods should account for 10-35% of your daily calories. Dairy Choose low-fat milk and dairy options. Dairy includes fat and protein, as well as calcium.  Fats and Oils Limit high-fat foods such as fried foods, sweets, baked goods, sugary drinks.  Other Creamy sauces and condiments, such as mayonnaise, can add  extra fat. Think about whether or not you need to use them, or use smaller amounts or low fat options. WHAT FOODS ARE NOT RECOMMENDED?  High fat foods, such as:  [employer].  Ice cream.  [place] toast.  Sweet rolls.  Pizza.  Cheese bread.  Foods covered with batter, butter, creamy sauces, or cheese.  Fried foods.  Sugary drinks and desserts.  Foods that cause gas or bloating   This information is not intended to replace advice given to you by your health care provider. Make sure you discuss any questions you have with your health care provider.   Document Released: 09/20/2013 Document Reviewed: 09/20/2013 Elsevier Interactive Patient Education [employer]

## 2019-11-28 NOTE — Telephone Encounter (Signed)
Pt has been advised of pre admission date/time, Covid Testing date and Surgery date.  Surgery Date: 12/07/19 Preadmission Testing Date: 12/01/19 (8a-1p) Covid Testing Date: 12/05/19 - patient [facility] [facility])  Patient has been made aware to call [phone], between 1-3:00pm the day before surgery, to find out what time to arrive.

## 2019-11-28 NOTE — H&P (View-Only) (Signed)
11/28/2019  History of Present Illness: Jeff Bray is a 43 y.o. male previously seen on 01/06/2018 for symptomatic cholelithiasis.  He has been having issues since 2012 but after further discussion with him, he opted for conservative management.  He has been doing initially well but then over the winter and particularly since this year he has been having more constant and frequent episodes of right upper quadrant pain.  He denies any other symptoms associated with this such as nausea or vomiting and reports only pain mostly in the front portion of the right upper quadrant with only sometimes radiation to the back.  He has tried changing his diet and even started a vegan diet earlier this year but only worked temporarily.  Denies having any fevers, chills, chest pain, shortness of breath.  However he does report having loose stools.  Past Medical History: Past Medical History:  Diagnosis Date  . Cholelithiasis      Past Surgical History: -None  Home Medications: Prior to Admission medications   Medication Sig Start Date End Date Taking? Authorizing Provider  Ascorbic Acid (VITAMIN C) 1000 MG tablet Take 1,000 mg by mouth daily.   Yes [provider]  Cholecalciferol (VITAMIN D3) 125 MCG (5000 UT) CAPS Take by mouth.   Yes [provider]  Multiple Vitamins-Minerals (DAILY HEART HEALTH SUPPORT PO) Take by mouth daily.   Yes [provider]  NON FORMULARY flucome-immune support   Yes [provider]  vitamin k 100 MCG tablet Take 100 mcg by mouth daily.   Yes [provider]    Allergies: No Known Allergies  Review of Systems: Review of Systems  Constitutional: Negative for chills and fever.  Respiratory: Negative for shortness of breath.   Cardiovascular: Negative for chest pain.  Gastrointestinal: Positive for abdominal pain and diarrhea. Negative for constipation, nausea and vomiting.    Physical Exam BP 130/80   Pulse 97   Temp 98.1  F (36.7 C)   Resp 14   Ht 6' 2" (1.88 m)   Wt 265 lb (120.2 kg)   SpO2 95%   BMI 34.02 kg/m  CONSTITUTIONAL: No acute distress HEENT:  Normocephalic, atraumatic, extraocular motion intact. RESPIRATORY:  Lungs are clear, and breath sounds are equal bilaterally. Normal respiratory effort without pathologic use of accessory muscles. CARDIOVASCULAR: Heart is regular without murmurs, gallops, or rubs. GI: The abdomen is soft, nondistended, with mild tenderness in the right upper quadrant that he qualifies as 5 or 6 out of 10.  NEUROLOGIC:  Motor and sensation is grossly normal.  Cranial nerves are grossly intact. PSYCH:  Alert and oriented to person, place and time. Affect is normal.  Labs/Imaging: No recent labs or imaging in our system  Assessment and Plan: This is a 43 y.o. male with symptomatic cholelithiasis with failed conservative management.  -Discussed with the patient that since conservative management is no longer working or helping him, we should proceed to surgical management.  He is in agreement.  Discussed with him the role for laparoscopic cholecystectomy, particularly discussing the case at length including the risks of bleeding, infection, injury to surrounding structures.  Discussed with him postoperative recovery, time off from work, as well as activity restrictions of no heavy lifting or pushing of no more than 10 to 15 pounds for period of 4 weeks.  Patient understands all these instructions and he is willing to proceed.  He understands also that he would need a Covid test prior to surgery. -We will schedule the   patient for surgery on 12/07/2019.  Face-to-face time spent with the patient and care providers was 25 minutes, with more than 50% of the time spent counseling, educating, and coordinating care of the patient.     Howie Ill, MD [facility]

## 2019-12-01 ENCOUNTER — Other Ambulatory Visit: Payer: Self-pay

## 2019-12-01 ENCOUNTER — Telehealth: Payer: Self-pay | Admitting: Surgery

## 2019-12-01 ENCOUNTER — Encounter
Admission: RE | Admit: 2019-12-01 | Discharge: 2019-12-01 | Disposition: A | Discharge status: Home / Self Care | Payer: 59 | Source: Ambulatory Visit | Attending: Surgery | Admitting: Surgery

## 2019-12-01 DIAGNOSIS — Z01818 Encounter for other preprocedural examination: Secondary | ICD-10-CM | POA: Diagnosis present

## 2019-12-01 DIAGNOSIS — Z20822 Contact with and (suspected) exposure to covid-19: Secondary | ICD-10-CM | POA: Diagnosis not present

## 2019-12-01 DIAGNOSIS — I10 Essential (primary) hypertension: Secondary | ICD-10-CM | POA: Diagnosis not present

## 2019-12-01 DIAGNOSIS — R9431 Abnormal electrocardiogram [ECG] [EKG]: Secondary | ICD-10-CM | POA: Insufficient documentation

## 2019-12-01 HISTORY — DX: Other specified health status: Z78.9

## 2019-12-01 HISTORY — DX: Essential (primary) hypertension: I10

## 2019-12-01 HISTORY — DX: Anemia, unspecified: D64.9

## 2019-12-01 LAB — CBC
HCT: 47.8 % (ref 39.0–52.0)
Hemoglobin: 16.6 g/dL (ref 13.0–17.0)
MCH: 30.6 pg (ref 26.0–34.0)
MCHC: 34.7 g/dL (ref 30.0–36.0)
MCV: 88.2 fL (ref 80.0–100.0)
Platelets: 273 10*3/uL (ref 150–400)
RBC: 5.42 MIL/uL (ref 4.22–5.81)
RDW: 12.2 % (ref 11.5–15.5)
WBC: 10.3 10*3/uL (ref 4.0–10.5)
nRBC: 0 % (ref 0.0–0.2)

## 2019-12-01 LAB — BASIC METABOLIC PANEL
Anion gap: 3 — ABNORMAL LOW (ref 5–15)
BUN: 13 mg/dL (ref 6–20)
CO2: 31 mmol/L (ref 22–32)
Calcium: 9.6 mg/dL (ref 8.9–10.3)
Chloride: 101 mmol/L (ref 98–111)
Creatinine, Ser: 0.84 mg/dL (ref 0.61–1.24)
GFR calc Af Amer: >60 mL/min (ref >60)
GFR calc non Af Amer: >60 mL/min (ref >60)
Glucose, Bld: 99 mg/dL (ref 70–99)
Potassium: 3.9 mmol/L (ref 3.5–5.1)
Sodium: 135 mmol/L (ref 135–145)

## 2019-12-01 NOTE — Telephone Encounter (Signed)
Pt called after speaking w/Pre-Admit for phone interview advising he would like to move his surgery from 12/07/19 to 12/02/19, as that was an option when he saw Dr. Aleen Campi earlier in the week.  After speaking w/pre-admit & confirming the ability to get COVID testing today, the pt is on his way to [facility] now.  As well, [employer] has been contacted to initiate an auth for surgery.  Although the Berkley Harvey is pending ([id number]), the pt is aware that it has not been officially approved, therefore if [facility] denies the request he will be responsible for all costs associated w/surgery.  OR charge nurse has rescheduled him to 12/02/19 & the auth/cert screen will be updated to include auth notes/communication.

## 2019-12-01 NOTE — Anesthesia Preprocedure Evaluation (Addendum)
Anesthesia Evaluation  Patient identified by MRN, date of birth, ID band Patient awake    Reviewed: Allergy & Precautions, H&P , NPO status , Patient's Chart, lab work & pertinent test results  Airway Mallampati: III  TM Distance: >3 FB Neck ROM: full    Dental  (+) Chipped   Pulmonary neg COPD, former smoker,  Suspected OSA, undiagnosed.  Polysomnogram scheduled later this week          Cardiovascular hypertension, (-) angina(-) Past MI and (-) Cardiac Stents (-) dysrhythmias      Neuro/Psych negative neurological ROS  negative psych ROS   GI/Hepatic negative GI ROS, Neg liver ROS,   Endo/Other  negative endocrine ROS  Renal/GU      Musculoskeletal   Abdominal   Peds  Hematology negative hematology ROS (+)   Anesthesia Other Findings Obesity  Past Medical History: No date: Anemia     Comment:  vitamin d deficiency No date: Cholelithiasis No date: Hypertension No date: Medical history non-contributory   Reproductive/Obstetrics negative OB ROS                          Anesthesia Physical Anesthesia Plan  ASA: II  Anesthesia Plan: General ETT   Post-op Pain Management:    Induction:   PONV Risk Score and Plan: Ondansetron, Dexamethasone, Midazolam and Treatment may vary due to age or medical condition  Airway Management Planned:   Additional Equipment:   Intra-op Plan:   Post-operative Plan:   Informed Consent: I have reviewed the patients History and Physical, chart, labs and discussed the procedure including the risks, benefits and alternatives for the proposed anesthesia with the patient or authorized representative who has indicated his/her understanding and acceptance.     Dental Advisory Given  Plan Discussed with: Anesthesiologist  Anesthesia Plan Comments:        Anesthesia Quick Evaluation

## 2019-12-01 NOTE — Patient Instructions (Signed)
INSTRUCTIONS FOR SURGERY     Your surgery is scheduled for:   Wednesday, MARCH 10TH     To find out your arrival time for the day of surgery,          please call [phone] between 1 pm and 3 pm on :   Tuesday, MARCH 9TH     When you arrive for surgery, report to the [facility].       Do NOT stop on the first floor to register.    REMEMBER: Instructions that are not followed completely may result in serious medical risk,  up to and including death, or upon the discretion of your surgeon and anesthesiologist,            your surgery may need to be rescheduled.  __X__ 1. Do not eat food after midnight the night before your procedure.                    No gum, candy, lozenger, tic tacs, tums or hard candies.                  ABSOLUTELY NOTHING SOLID IN YOUR MOUTH AFTER MIDNIGHT                    You may drink unlimited clear liquids up to 2 hours before you are scheduled to arrive for surgery.                   Do not drink anything within those 2 hours unless you need to take medicine, then take the                   smallest amount you need.  Clear liquids include:  water, apple juice without pulp,                   any flavor Gatorade, Black coffee, black tea.  Sugar may be added but no dairy/ honey /lemon.                        Broth and jello is not considered a clear liquid.  __x__  2. On the morning of surgery, please brush your teeth with toothpaste and water. You may rinse with                  mouthwash if you wish but DO NOT SWALLOW TOOTHPASTE OR MOUTHWASH  __X___3. NO alcohol for 24 hours before or after surgery.  __x___ 4.  Do NOT smoke or use e-cigarettes for 24 HOURS PRIOR TO SURGERY.                      DO NOT Use any chewable tobacco products for at least 6 hours prior to surgery.  __x___ 5. If you start any new medication after this appointment and prior to surgery, please       Bring it with you on the day of surgery.  ___x__ 6. Notify your doctor if there is any change in your medical condition, such as fever,  infection, vomitting, diarrhea or any open sores.  __x___ 7.  USE the CHG SOAP as instructed, the night before surgery and the day of surgery.                   Once you have washed with this soap, do NOT use any of the following: Powders, perfumes                    or lotions. Please do not wear make up, hairpins, clips or nail polish. You MAY wear deodorant.                   Men may shave their face and neck.  Women need to shave 48 hours prior to surgery.                   DO NOT wear ANY jewelry on the day of surgery. If there are rings that are too tight to                    remove easily, please address this prior to the surgery day. Piercings need to be removed.                                                                     NO METAL ON YOUR BODY.                    Do NOT bring any valuables.  If you came to Pre-Admit testing then you will not need license,                     insurance card or credit card.  If you will be staying overnight, please either leave your things in                     the car or have your family be responsible for these items.                     [facility] [facility] IS NOT RESPONSIBLE FOR BELONGINGS OR VALUABLES.  ___X__ 8. DO NOT wear contact lenses on surgery day.  You may not have dentures,                     Hearing aides, contacts or glasses in the operating room. These items can be                    Placed in the Recovery Room to receive immediately after surgery.  __x___ 9. IF YOU ARE SCHEDULED TO GO HOME ON THE SAME DAY, YOU MUST                   Have someone to drive you home and to stay with you  for the first 24 hours.                    Have an arrangement prior to arriving on surgery day.  ___x__ 10. Take the following medications on the morning of surgery with a sip of water:  1. NOTHING                     2.                     _____ 11.  Follow any instructions provided to you by your surgeon.                        Such as enema, clear liquid bowel prep  __X__  12. STOP  ASPIRIN AS OF: Thursday, MARCH 4TH                       THIS INCLUDES BC POWDERS / GOODIES POWDER  __x___ 13. STOP Anti-inflammatories as of: Thursday, MARCH 4TH                      This includes IBUPROFEN / MOTRIN / ADVIL / ALEVE/ NAPROXYN                    YOU MAY TAKE TYLENOL ANY TIME PRIOR TO SURGERY.  __X___ 14.  Stop supplements until after surgery.                     This includes:  VITAMIN C // MULTIVITAMINS // VITAMIN K // CARDITUNE // FLUCASUME                 You may continue taking Vitamin B12 / Vitamin D3 but do not take on the morning of surgery.  __X___17.  Continue to take the following medications but do not take on the morning of surgery:                      VITAMIN D3  ______18.   Wear clean and comfortable clothing to the hospital.  IF YOU COMPLETE THE MEDICAL DIRECTIVES, PLEASE BRING WITH YOU SO WE CAN PUT  A COPY IN YOUR CHART. HAVE STOOL SOFTENERS TO USE WHEN HOME.    START TAKING THEM A DAY OR 2 PRIOR TO SURGERY HAVE A FIRM PILLOW AT HOME TO USE AS ABDOMINAL SUPPORT.

## 2019-12-02 ENCOUNTER — Inpatient Hospital Stay: Admission: RE | Admit: 2019-12-02 | Payer: 59 | Source: Ambulatory Visit

## 2019-12-02 LAB — SARS CORONAVIRUS 2 (TAT 6-24 HRS): SARS Coronavirus 2: NEGATIVE

## 2019-12-05 ENCOUNTER — Other Ambulatory Visit: Payer: 59

## 2019-12-05 ENCOUNTER — Other Ambulatory Visit
Admission: RE | Admit: 2019-12-05 | Discharge: 2019-12-05 | Disposition: A | Discharge status: Home / Self Care | Payer: 59 | Source: Ambulatory Visit | Attending: Surgery | Admitting: Surgery

## 2019-12-05 ENCOUNTER — Other Ambulatory Visit: Payer: Self-pay

## 2019-12-05 DIAGNOSIS — Z01812 Encounter for preprocedural laboratory examination: Secondary | ICD-10-CM | POA: Insufficient documentation

## 2019-12-05 DIAGNOSIS — Z20822 Contact with and (suspected) exposure to covid-19: Secondary | ICD-10-CM | POA: Diagnosis not present

## 2019-12-05 LAB — SARS CORONAVIRUS 2 (TAT 6-24 HRS): SARS Coronavirus 2: NEGATIVE

## 2019-12-07 ENCOUNTER — Encounter
Admission: RE | Disposition: A | Discharge status: Home / Self Care | Payer: Self-pay | Source: Ambulatory Visit | Attending: Surgery

## 2019-12-07 ENCOUNTER — Other Ambulatory Visit: Payer: Self-pay

## 2019-12-07 ENCOUNTER — Ambulatory Visit
Admission: RE | Admit: 2019-12-07 | Discharge: 2019-12-07 | Disposition: A | Discharge status: Home / Self Care | Payer: 59 | Source: Ambulatory Visit | Attending: Surgery | Admitting: Surgery

## 2019-12-07 ENCOUNTER — Ambulatory Visit: Payer: 59 | Admitting: Anesthesiology

## 2019-12-07 ENCOUNTER — Encounter: Payer: Self-pay | Admitting: Surgery

## 2019-12-07 DIAGNOSIS — K828 Other specified diseases of gallbladder: Secondary | ICD-10-CM | POA: Diagnosis not present

## 2019-12-07 DIAGNOSIS — I10 Essential (primary) hypertension: Secondary | ICD-10-CM | POA: Insufficient documentation

## 2019-12-07 DIAGNOSIS — E669 Obesity, unspecified: Secondary | ICD-10-CM | POA: Diagnosis not present

## 2019-12-07 DIAGNOSIS — Z6834 Body mass index (BMI) 34.0-34.9, adult: Secondary | ICD-10-CM | POA: Insufficient documentation

## 2019-12-07 DIAGNOSIS — E568 Deficiency of other vitamins: Secondary | ICD-10-CM | POA: Insufficient documentation

## 2019-12-07 DIAGNOSIS — Z79899 Other long term (current) drug therapy: Secondary | ICD-10-CM | POA: Insufficient documentation

## 2019-12-07 DIAGNOSIS — Z87891 Personal history of nicotine dependence: Secondary | ICD-10-CM | POA: Insufficient documentation

## 2019-12-07 DIAGNOSIS — E559 Vitamin D deficiency, unspecified: Secondary | ICD-10-CM | POA: Diagnosis not present

## 2019-12-07 DIAGNOSIS — D649 Anemia, unspecified: Secondary | ICD-10-CM | POA: Insufficient documentation

## 2019-12-07 DIAGNOSIS — K8020 Calculus of gallbladder without cholecystitis without obstruction: Secondary | ICD-10-CM

## 2019-12-07 DIAGNOSIS — K8010 Calculus of gallbladder with chronic cholecystitis without obstruction: Secondary | ICD-10-CM | POA: Diagnosis present

## 2019-12-07 HISTORY — PX: CHOLECYSTECTOMY: SHX55

## 2019-12-07 SURGERY — LAPAROSCOPIC CHOLECYSTECTOMY
Anesthesia: General

## 2019-12-07 MED ORDER — FAMOTIDINE 20 MG PO TABS
ORAL_TABLET | ORAL | Status: AC
  Administered 2019-12-07: 20 mg via ORAL
  Filled 2019-12-07: qty 1

## 2019-12-07 MED ORDER — OXYCODONE HCL 5 MG PO TABS: 5.0000 mg | ORAL_TABLET | Freq: Once | ORAL | Status: DC | PRN

## 2019-12-07 MED ORDER — ONDANSETRON HCL 4 MG/2ML IJ SOLN
INTRAMUSCULAR | Status: AC
  Filled 2019-12-07: qty 2

## 2019-12-07 MED ORDER — OXYCODONE HCL 5 MG/5ML PO SOLN: 5.0000 mg | Freq: Once | ORAL | Status: DC | PRN

## 2019-12-07 MED ORDER — SUGAMMADEX SODIUM 500 MG/5ML IV SOLN
INTRAVENOUS | Status: AC
  Filled 2019-12-07: qty 5

## 2019-12-07 MED ORDER — PROPOFOL 10 MG/ML IV BOLUS
INTRAVENOUS | Status: DC | PRN
  Administered 2019-12-07: 200 mg via INTRAVENOUS

## 2019-12-07 MED ORDER — SUGAMMADEX SODIUM 500 MG/5ML IV SOLN
INTRAVENOUS | Status: DC | PRN
  Administered 2019-12-07: 250 mg via INTRAVENOUS

## 2019-12-07 MED ORDER — DEXTROSE 5 % IV SOLN
3.0000 g | INTRAVENOUS | Status: AC
  Administered 2019-12-07: 09:00:00 3 g via INTRAVENOUS
  Filled 2019-12-07: qty 3

## 2019-12-07 MED ORDER — MIDAZOLAM HCL 2 MG/2ML IJ SOLN
INTRAMUSCULAR | Status: AC
  Filled 2019-12-07: qty 2

## 2019-12-07 MED ORDER — FENTANYL CITRATE (PF) 100 MCG/2ML IJ SOLN
25.0000 ug | INTRAMUSCULAR | Status: DC | PRN
  Administered 2019-12-07 (×2): 25 ug via INTRAVENOUS

## 2019-12-07 MED ORDER — ACETAMINOPHEN 500 MG PO TABS: 1000.0000 mg | ORAL_TABLET | ORAL | Status: AC

## 2019-12-07 MED ORDER — ONDANSETRON HCL 4 MG/2ML IJ SOLN
INTRAMUSCULAR | Status: DC | PRN
  Administered 2019-12-07: 4 mg via INTRAVENOUS

## 2019-12-07 MED ORDER — LACTATED RINGERS IV SOLN: INTRAVENOUS | Status: DC

## 2019-12-07 MED ORDER — DEXAMETHASONE SODIUM PHOSPHATE 10 MG/ML IJ SOLN
INTRAMUSCULAR | Status: DC | PRN
  Administered 2019-12-07: 10 mg via INTRAVENOUS

## 2019-12-07 MED ORDER — FENTANYL CITRATE (PF) 100 MCG/2ML IJ SOLN
INTRAMUSCULAR | Status: DC | PRN
  Administered 2019-12-07: 100 ug via INTRAVENOUS
  Administered 2019-12-07: 50 ug via INTRAVENOUS

## 2019-12-07 MED ORDER — FENTANYL CITRATE (PF) 100 MCG/2ML IJ SOLN
INTRAMUSCULAR | Status: AC
  Filled 2019-12-07: qty 2

## 2019-12-07 MED ORDER — KETOROLAC TROMETHAMINE 30 MG/ML IJ SOLN
INTRAMUSCULAR | Status: AC
  Filled 2019-12-07: qty 1

## 2019-12-07 MED ORDER — DEXAMETHASONE SODIUM PHOSPHATE 10 MG/ML IJ SOLN
INTRAMUSCULAR | Status: AC
  Filled 2019-12-07: qty 1

## 2019-12-07 MED ORDER — EPHEDRINE SULFATE 50 MG/ML IJ SOLN
INTRAMUSCULAR | Status: DC | PRN
  Administered 2019-12-07: 15 mg via INTRAVENOUS
  Administered 2019-12-07: 10 mg via INTRAVENOUS

## 2019-12-07 MED ORDER — EPHEDRINE 5 MG/ML INJ
INTRAVENOUS | Status: AC
  Filled 2019-12-07: qty 10

## 2019-12-07 MED ORDER — EPINEPHRINE PF 1 MG/ML IJ SOLN
INTRAMUSCULAR | Status: AC
  Filled 2019-12-07: qty 1

## 2019-12-07 MED ORDER — OXYCODONE HCL 5 MG PO TABS: 5.0000 mg | ORAL_TABLET | ORAL | 0 refills | Status: DC | PRN

## 2019-12-07 MED ORDER — CHLORHEXIDINE GLUCONATE CLOTH 2 % EX PADS: 6.0000 | MEDICATED_PAD | Freq: Once | CUTANEOUS | Status: DC

## 2019-12-07 MED ORDER — ROCURONIUM BROMIDE 100 MG/10ML IV SOLN
INTRAVENOUS | Status: DC | PRN
  Administered 2019-12-07 (×2): 20 mg via INTRAVENOUS
  Administered 2019-12-07: 30 mg via INTRAVENOUS
  Administered 2019-12-07: 20 mg via INTRAVENOUS

## 2019-12-07 MED ORDER — ACETAMINOPHEN 500 MG PO TABS
ORAL_TABLET | ORAL | Status: AC
  Administered 2019-12-07: 1000 mg via ORAL
  Filled 2019-12-07: qty 2

## 2019-12-07 MED ORDER — GABAPENTIN 300 MG PO CAPS: 300.0000 mg | ORAL_CAPSULE | ORAL | Status: AC

## 2019-12-07 MED ORDER — SEVOFLURANE IN SOLN
RESPIRATORY_TRACT | Status: AC
  Filled 2019-12-07: qty 250

## 2019-12-07 MED ORDER — BUPIVACAINE HCL (PF) 0.25 % IJ SOLN
INTRAMUSCULAR | Status: AC
  Filled 2019-12-07: qty 30

## 2019-12-07 MED ORDER — PROPOFOL 10 MG/ML IV BOLUS
INTRAVENOUS | Status: AC
  Filled 2019-12-07: qty 20

## 2019-12-07 MED ORDER — LIDOCAINE HCL (CARDIAC) PF 100 MG/5ML IV SOSY
PREFILLED_SYRINGE | INTRAVENOUS | Status: DC | PRN
  Administered 2019-12-07: 60 mg via INTRAVENOUS

## 2019-12-07 MED ORDER — MIDAZOLAM HCL 2 MG/2ML IJ SOLN
INTRAMUSCULAR | Status: DC | PRN
  Administered 2019-12-07: 2 mg via INTRAVENOUS

## 2019-12-07 MED ORDER — SUCCINYLCHOLINE CHLORIDE 20 MG/ML IJ SOLN
INTRAMUSCULAR | Status: DC | PRN
  Administered 2019-12-07: 140 mg via INTRAVENOUS

## 2019-12-07 MED ORDER — PROMETHAZINE HCL 25 MG/ML IJ SOLN: 6.2500 mg | INTRAMUSCULAR | Status: DC | PRN

## 2019-12-07 MED ORDER — FAMOTIDINE 20 MG PO TABS: 20.0000 mg | ORAL_TABLET | Freq: Once | ORAL | Status: AC

## 2019-12-07 MED ORDER — GABAPENTIN 300 MG PO CAPS
ORAL_CAPSULE | ORAL | Status: AC
  Administered 2019-12-07: 300 mg via ORAL
  Filled 2019-12-07: qty 1

## 2019-12-07 MED ORDER — KETOROLAC TROMETHAMINE 30 MG/ML IJ SOLN
INTRAMUSCULAR | Status: DC | PRN
  Administered 2019-12-07: 30 mg via INTRAVENOUS

## 2019-12-07 MED ORDER — BUPIVACAINE-EPINEPHRINE (PF) 0.25% -1:200000 IJ SOLN
INTRAMUSCULAR | Status: DC | PRN
  Administered 2019-12-07: 30 mL via PERINEURAL

## 2019-12-07 MED ORDER — IBUPROFEN 600 MG PO TABS: 600.0000 mg | ORAL_TABLET | Freq: Three times a day (TID) | ORAL | 1 refills | Status: AC | PRN

## 2019-12-07 SURGICAL SUPPLY — 54 items
"PENCIL ELECTRO HAND CTR " (MISCELLANEOUS) ×1 IMPLANT
ADH SKN CLS APL DERMABOND .7 (GAUZE/BANDAGES/DRESSINGS) ×1
APL PRP STRL LF DISP 70% ISPRP (MISCELLANEOUS) ×1
APPLIER CLIP 5 13 M/L LIGAMAX5 (MISCELLANEOUS) ×3
APR CLP MED LRG 5 ANG JAW (MISCELLANEOUS) ×1
BAG SPEC RTRVL LRG 6X4 10 (ENDOMECHANICALS) ×1
BLADE SURG 15 STRL LF DISP TIS (BLADE) ×1 IMPLANT
BLADE SURG 15 STRL SS (BLADE) ×3
CANISTER SUCT 1200ML W/VALVE (MISCELLANEOUS) ×3 IMPLANT
CATH CHOLANGI 4FR 420404F (CATHETERS) IMPLANT
CHLORAPREP W/TINT 26 (MISCELLANEOUS) ×3 IMPLANT
CLIP APPLIE 5 13 M/L LIGAMAX5 (MISCELLANEOUS) ×1 IMPLANT
CONRAY 60ML FOR OR (MISCELLANEOUS) ×1 IMPLANT
COVER WAND RF STERILE (DRAPES) ×3 IMPLANT
DERMABOND ADVANCED (GAUZE/BANDAGES/DRESSINGS) ×2
DERMABOND ADVANCED .7 DNX12 (GAUZE/BANDAGES/DRESSINGS) ×1 IMPLANT
DRAPE C-ARM XRAY 36X54 (DRAPES) IMPLANT
ELECT CAUTERY BLADE TIP 2.5 (TIP) ×3
ELECT REM PT RETURN 9FT ADLT (ELECTROSURGICAL) ×3
ELECTRODE CAUTERY BLDE TIP 2.5 (TIP) ×1 IMPLANT
ELECTRODE REM PT RTRN 9FT ADLT (ELECTROSURGICAL) ×1 IMPLANT
GLOVE SURG SYN 7.0 (GLOVE) ×3 IMPLANT
GLOVE SURG SYN 7.0 PF PI (GLOVE) ×1 IMPLANT
GLOVE SURG SYN 7.5  E (GLOVE) ×3
GLOVE SURG SYN 7.5 E (GLOVE) ×1 IMPLANT
GLOVE SURG SYN 7.5 PF PI (GLOVE) ×1 IMPLANT
GOWN STRL REUS W/ TWL LRG LVL3 (GOWN DISPOSABLE) ×3 IMPLANT
GOWN STRL REUS W/TWL LRG LVL3 (GOWN DISPOSABLE) ×9
IRRIGATION STRYKERFLOW (MISCELLANEOUS) IMPLANT
IRRIGATOR STRYKERFLOW (MISCELLANEOUS)
IV CATH ANGIO 12GX3 LT BLUE (NEEDLE) ×1 IMPLANT
IV NS 1000ML (IV SOLUTION)
IV NS 1000ML BAXH (IV SOLUTION) IMPLANT
JACKSON PRATT 10 (INSTRUMENTS) IMPLANT
L-HOOK LAP DISP 36CM (ELECTROSURGICAL) ×3
LABEL OR SOLS (LABEL) ×3 IMPLANT
LHOOK LAP DISP 36CM (ELECTROSURGICAL) ×1 IMPLANT
NEEDLE HYPO 22GX1.5 SAFETY (NEEDLE) ×6 IMPLANT
PACK LAP CHOLECYSTECTOMY (MISCELLANEOUS) ×3 IMPLANT
PENCIL ELECTRO HAND CTR (MISCELLANEOUS) ×3 IMPLANT
POUCH SPECIMEN RETRIEVAL 10MM (ENDOMECHANICALS) ×3 IMPLANT
SCISSORS METZENBAUM CVD 33 (INSTRUMENTS) ×3 IMPLANT
SET TUBE SMOKE EVAC HIGH FLOW (TUBING) ×3 IMPLANT
SLEEVE ADV FIXATION 5X100MM (TROCAR) ×9 IMPLANT
SPONGE VERSALON 4X4 4PLY (MISCELLANEOUS) IMPLANT
SUT MNCRL 4-0 (SUTURE) ×6
SUT MNCRL 4-0 27XMFL (SUTURE) ×2
SUT VIC AB 3-0 SH 27 (SUTURE) ×3
SUT VIC AB 3-0 SH 27X BRD (SUTURE) IMPLANT
SUT VICRYL 0 AB UR-6 (SUTURE) ×7 IMPLANT
SUTURE MNCRL 4-0 27XMF (SUTURE) ×1 IMPLANT
SYS KII FIOS ACCESS ABD 5X100 (TROCAR) ×3
SYSTEM KII FIOS ACES ABD 5X100 (TROCAR) ×1 IMPLANT
TROCAR BALLN GELPORT 12X130M (ENDOMECHANICALS) ×3 IMPLANT

## 2019-12-07 NOTE — Anesthesia Postprocedure Evaluation (Signed)
Anesthesia Post Note  Patient: Jeff Bray  Procedure(s) Performed: LAPAROSCOPIC CHOLECYSTECTOMY (N/A )  Patient location during evaluation: PACU Anesthesia Type: General Level of consciousness: awake and alert Pain management: pain level controlled Vital Signs Assessment: post-procedure vital signs reviewed and stable Respiratory status: spontaneous breathing, nonlabored ventilation and respiratory function stable Cardiovascular status: blood pressure returned to baseline and stable Postop Assessment: no apparent nausea or vomiting Anesthetic complications: no     Last Vitals:  Vitals:   12/07/19 1208 12/07/19 1235  BP: 101/61 118/74  Pulse: 78 77  Resp: 10 18  Temp: 36.7 C (!) 36.2 C  SpO2: 94% 99%    Last Pain:  Vitals:   12/07/19 1235  TempSrc: Temporal  PainSc: 3                  Karleen Hampshire

## 2019-12-07 NOTE — Interval H&P Note (Signed)
History and Physical Interval Note:  12/07/2019 8:30 AM  Jeff Bray  has presented today for surgery, with the diagnosis of symptomatic cholelithiasis.  The various methods of treatment have been discussed with the patient and family. After consideration of risks, benefits and other options for treatment, the patient has consented to  Procedure(s): LAPAROSCOPIC CHOLECYSTECTOMY (N/A) as a surgical intervention.  The patient's history has been reviewed, patient examined, no change in status, stable for surgery.  I have reviewed the patient's chart and labs.  Questions were answered to the patient's satisfaction.     [name]

## 2019-12-07 NOTE — Anesthesia Procedure Notes (Signed)
Procedure Name: Intubation Date/Time: 12/07/2019 9:07 AM Performed by: Omer Jack, CRNA Pre-anesthesia Checklist: Patient identified, Patient being monitored, Timeout performed, Emergency Drugs available and Suction available Patient Re-evaluated:Patient Re-evaluated prior to induction Oxygen Delivery Method: Circle system utilized Preoxygenation: Pre-oxygenation with 100% oxygen Induction Type: IV induction Ventilation: Mask ventilation without difficulty Laryngoscope Size: McGraph and 4 Grade View: Grade I Tube type: Oral Tube size: 7.5 mm Number of attempts: 2 Airway Equipment and Method: Stylet Placement Confirmation: ETT inserted through vocal cords under direct vision,  positive ETCO2 and breath sounds checked- equal and bilateral Secured at: 23 cm Tube secured with: Tape Dental Injury: Teeth and Oropharynx as per pre-operative assessment  Difficulty Due To: Difficult Airway- due to anterior larynx and Difficulty was anticipated

## 2019-12-07 NOTE — Op Note (Signed)
  Procedure Date:  12/07/2019  Pre-operative Diagnosis:  Symptomatic cholelithiasis  Post-operative Diagnosis:  Chronic cholecystitis  Procedure:  Laparoscopic cholecystectomy  Surgeon:  Howie Ill, MD  Anesthesia:  General endotracheal  Estimated Blood Loss:  10 ml  Specimens:  gallbladder  Complications:  None  Indications for Procedure:  This is a 44 y.o. male who presents with abdominal pain and workup revealing symptomatic cholelithiasis.  He initially opted for watchful waiting in 2019, but presented with more frequent and longer lasting episodes recently.  The benefits, complications, treatment options, and expected outcomes were discussed with the patient. The risks of bleeding, infection, recurrence of symptoms, failure to resolve symptoms, bile duct damage, bile duct leak, retained common bile duct stone, bowel injury, and need for further procedures were all discussed with the patient and he was willing to proceed.  Description of Procedure: The patient was correctly identified in the preoperative area and brought into the operating room.  The patient was placed supine with VTE prophylaxis in place.  Appropriate time-outs were performed.  Anesthesia was induced and the patient was intubated.  Appropriate antibiotics were infused.  The abdomen was prepped and draped in a sterile fashion. An infraumbilical incision was made. A cutdown technique was used to enter the abdominal cavity without injury, and a Hasson trocar was inserted.  Pneumoperitoneum was obtained with appropriate opening pressures.  A 5-mm port was placed in the subxiphoid area and two 5-mm ports were placed in the right upper quadrant under direct visualization.  The gallbladder was identified.  The fundus was grasped and retracted cephalad.  Adhesions were lysed bluntly and with electrocautery. The infundibulum was grasped and retracted laterally, exposing the peritoneum overlying the gallbladder.  This was  incised with electrocautery and extended on either side of the gallbladder.  The cystic duct and cystic artery were clearly identified and bluntly dissected.  Both were clipped twice proximally and once distally, cutting in between.  The gallbladder was taken from the gallbladder fossa in a retrograde fashion with electrocautery. The gallbladder was placed in an Endocatch bag. The liver bed was inspected and any bleeding was controlled with electrocautery. The right upper quadrant was then inspected again revealing intact clips, no bleeding, and no ductal injury.  The area was thoroughly irrigated.  The 5 mm ports were removed under direct visualization and the Hasson trocar was removed.  The Endocatch bag was brought out via the umbilical incision. The fascial opening was closed using 0 vicryl suture.  Local anesthetic was infused in all incisions and the incisions were closed with 4-0 Monocryl.  The wounds were cleaned and sealed with DermaBond.  The patient was emerged from anesthesia and extubated and brought to the recovery room for further management.  The patient tolerated the procedure well and all counts were correct at the end of the case.   Howie Ill, MD

## 2019-12-07 NOTE — Discharge Instructions (Signed)

## 2019-12-07 NOTE — Transfer of Care (Signed)
Immediate Anesthesia Transfer of Care Note  Patient: Jeff Bray  Procedure(s) Performed: LAPAROSCOPIC CHOLECYSTECTOMY (N/A )  Patient Location: PACU  Anesthesia Type:General  Level of Consciousness: sedated and responds to stimulation  Airway & Oxygen Therapy: Patient Spontanous Breathing and Patient connected to face mask oxygen  Post-op Assessment: Report given to RN and Post -op Vital signs reviewed and stable  Post vital signs: Reviewed and stable  Last Vitals:  Vitals Value Taken Time  BP 109/44 12/07/19 1123  Temp 36.6 C 12/07/19 1122  Pulse 80 12/07/19 1125  Resp 13 12/07/19 1125  SpO2 98 % 12/07/19 1125  Vitals shown include unvalidated device data.  Last Pain:  Vitals:   12/07/19 1122  TempSrc:   PainSc: Asleep         Complications: No apparent anesthesia complications

## 2019-12-08 LAB — SURGICAL PATHOLOGY

## 2019-12-09 ENCOUNTER — Other Ambulatory Visit: Payer: Self-pay

## 2019-12-09 ENCOUNTER — Ambulatory Visit: Payer: 59

## 2019-12-09 DIAGNOSIS — R0683 Snoring: Secondary | ICD-10-CM

## 2019-12-13 DIAGNOSIS — G4733 Obstructive sleep apnea (adult) (pediatric): Secondary | ICD-10-CM

## 2019-12-15 ENCOUNTER — Telehealth: Payer: Self-pay | Admitting: Surgery

## 2019-12-15 NOTE — Telephone Encounter (Signed)
He reports that he is having abdominal pain when he eats. He would like to be seen for this.

## 2019-12-15 NOTE — Telephone Encounter (Signed)
Patient called advising he is almost out of his pain medication (Oxycodone) & would like a refill sent to [facility] ([address] [place] in [place]), please.  Jeff Bray is best reached back @ [phone] for any further questions or concerns.  He is also asking for a call back advising when the rx has been sent/approved.  Thank you

## 2019-12-15 NOTE — Telephone Encounter (Signed)
Patient to come in tomorrow to be seen.

## 2019-12-16 ENCOUNTER — Encounter: Payer: 59 | Admitting: Surgery

## 2019-12-19 ENCOUNTER — Encounter: Payer: 59 | Admitting: Surgery

## 2019-12-19 ENCOUNTER — Telehealth: Payer: Self-pay

## 2019-12-20 ENCOUNTER — Telehealth: Payer: Self-pay | Admitting: Pulmonary Disease

## 2019-12-20 DIAGNOSIS — G4733 Obstructive sleep apnea (adult) (pediatric): Secondary | ICD-10-CM | POA: Diagnosis not present

## 2019-12-20 NOTE — Telephone Encounter (Signed)
Spoke with pt. He is aware of results. Pt has been scheduled with Tammy Parrett on 12/22/2019 at 1200. Nothing further was needed.

## 2019-12-20 NOTE — Telephone Encounter (Signed)
HST 12/13/19 >> AHI 15.7, SpO2 low 80%   Please inform him that his sleep study shows moderate obstructive sleep apnea.  Please arrange for ROV with me or NP to discuss treatment options.

## 2019-12-20 NOTE — Telephone Encounter (Signed)
Error

## 2019-12-21 ENCOUNTER — Other Ambulatory Visit: Payer: Self-pay

## 2019-12-21 ENCOUNTER — Ambulatory Visit (INDEPENDENT_AMBULATORY_CARE_PROVIDER_SITE_OTHER): Payer: Self-pay | Admitting: Surgery

## 2019-12-21 ENCOUNTER — Encounter: Payer: Self-pay | Admitting: Surgery

## 2019-12-21 VITALS — BP 133/83 | HR 79 | Temp 97.6°F | Resp 12 | Ht 74.0 in | Wt 264.6 lb

## 2019-12-21 DIAGNOSIS — K8020 Calculus of gallbladder without cholecystitis without obstruction: Secondary | ICD-10-CM

## 2019-12-21 DIAGNOSIS — R1011 Right upper quadrant pain: Secondary | ICD-10-CM

## 2019-12-21 MED ORDER — OXYCODONE HCL 5 MG PO TABS: 5.0000 mg | ORAL_TABLET | Freq: Four times a day (QID) | ORAL | 0 refills | Status: AC | PRN

## 2019-12-21 NOTE — Progress Notes (Signed)
12/21/2019  HPI: Jeff Bray is a 44 y.o. male s/p laparoscopic cholecystectomy on 12/07/19.  He presents today for follow up.  He reports that since surgery, he's still having RUQ pain episodes, particularly after each meal.  Some meals may be better, but for the most part there is pain after meals.  Also having nausea, and only one episode of emesis.  His appetite has been decreased, but having normal bowel function.  Denies any fevers, chills, denies any skin color changes/jaundice.  Vital signs: BP 133/83   Pulse 79   Temp 97.6 F (36.4 C)   Resp 12   Ht 6\' 2"  (1.88 m)   Wt 264 lb 9.6 oz (120 kg)   SpO2 96%   BMI 33.97 kg/m    Physical Exam: Constitutional: No acute distress Abdomen:  Soft, non-distended, currently non-tender to palpation.  Incisions are clean, dry, intact, without any evidence of breakdown or infection. Skin:  No jaundice.  Assessment/Plan: This is a 44 y.o. male s/p laparoscopic cholecystectomy  --Discussed with him that his symptoms could represent a retained stone or sludge, or possibly a fluid collection in the gallbladder fossa.  I think it's best to start with a stat RUQ ultrasound to evaluate for any fluid or biliary dilatation.  Based on the results, may need HIDA scan vs MRCP for further evaluation.  If everything is negative, discussed with him that this could potentially be his body adjusting after surgery, but I think it's most prudent to get imaging first.  He's in agreement with this plan.   --Discussed with him as well that I will be out of town tomorrow and Friday.  I will ask one of my partners to follow up on the results with him.   --Continue no heavy lifting or pushing restriction until 4 weeks out.   Friday, MD [facility]

## 2019-12-21 NOTE — Patient Instructions (Addendum)
Your Ultrasound appointment is scheduled for 12/22/19 at 930am at the [facility]. Arrival at 830am. Nothing to eat or drink after midnight  We will call you with results.   GENERAL POST-OPERATIVE PATIENT INSTRUCTIONS   WOUND CARE INSTRUCTIONS:  Keep a dry clean dressing on the wound if there is drainage. The initial bandage may be removed after 24 hours.  Once the wound has quit draining you may leave it open to air.  If clothing rubs against the wound or causes irritation and the wound is not draining you may cover it with a dry dressing during the daytime.  Try to keep the wound dry and avoid ointments on the wound unless directed to do so.  If the wound becomes bright red and painful or starts to drain infected material that is not clear, please contact your physician immediately.  If the wound is mildly pink and has a thick firm ridge underneath it, this is normal, and is referred to as a healing ridge.  This will resolve over the next 4-6 weeks.  BATHING: You may shower if you have been informed of this by your surgeon. However, Please do not submerge in a tub, hot tub, or pool until incisions are completely sealed or have been told by your surgeon that you may do so.  DIET:  You may eat any foods that you can tolerate.  It is a good idea to eat a high fiber diet and take in plenty of fluids to prevent constipation.  If you do become constipated you may want to take a mild laxative or take ducolax tablets on a daily basis until your bowel habits are regular.  Constipation can be very uncomfortable, along with straining, after recent surgery.  ACTIVITY:  You are encouraged to cough and deep breath or use your incentive spirometer if you were given one, every 15-30 minutes when awake.  This will help prevent respiratory complications and low grade fevers post-operatively if you had a general anesthetic.  You may want to hug a pillow when coughing and sneezing to add additional support to the  surgical area, if you had abdominal or chest surgery, which will decrease pain during these times.  You are encouraged to walk and engage in light activity for the next two weeks.  You should not lift more than 20 pounds, until 01/04/20 as it could put you at increased risk for complications.  Twenty pounds is roughly equivalent to a plastic bag of groceries. At that time- Listen to your body when lifting, if you have pain when lifting, stop and then try again in a few days. Soreness after doing exercises or activities of daily living is normal as you get back in to your normal routine.  MEDICATIONS:  Try to take narcotic medications and anti-inflammatory medications, such as tylenol, ibuprofen, naprosyn, etc., with food.  This will minimize stomach upset from the medication.  Should you develop nausea and vomiting from the pain medication, or develop a rash, please discontinue the medication and contact your physician.  You should not drive, make important decisions, or operate machinery when taking narcotic pain medication.  SUNBLOCK Use sun block to incision area over the next year if this area will be exposed to sun. This helps decrease scarring and will allow you avoid a permanent darkened area over your incision.  QUESTIONS:  Please feel free to call our office if you have any questions, and we will be glad to assist you.

## 2019-12-22 ENCOUNTER — Ambulatory Visit (INDEPENDENT_AMBULATORY_CARE_PROVIDER_SITE_OTHER): Payer: 59 | Admitting: Adult Health

## 2019-12-22 ENCOUNTER — Other Ambulatory Visit: Payer: Self-pay

## 2019-12-22 ENCOUNTER — Telehealth: Payer: Self-pay

## 2019-12-22 ENCOUNTER — Other Ambulatory Visit
Admission: RE | Admit: 2019-12-22 | Discharge: 2019-12-22 | Disposition: A | Discharge status: Home / Self Care | Payer: 59 | Source: Ambulatory Visit | Attending: Surgery | Admitting: Surgery

## 2019-12-22 ENCOUNTER — Encounter: Payer: Self-pay | Admitting: Adult Health

## 2019-12-22 ENCOUNTER — Ambulatory Visit
Admission: RE | Admit: 2019-12-22 | Discharge: 2019-12-22 | Disposition: A | Discharge status: Home / Self Care | Payer: 59 | Source: Ambulatory Visit | Attending: Surgery | Admitting: Surgery

## 2019-12-22 VITALS — BP 118/66 | HR 82 | Temp 97.6°F | Ht 74.0 in | Wt 264.0 lb

## 2019-12-22 DIAGNOSIS — R109 Unspecified abdominal pain: Secondary | ICD-10-CM

## 2019-12-22 DIAGNOSIS — G4733 Obstructive sleep apnea (adult) (pediatric): Secondary | ICD-10-CM | POA: Diagnosis not present

## 2019-12-22 DIAGNOSIS — K8020 Calculus of gallbladder without cholecystitis without obstruction: Secondary | ICD-10-CM | POA: Diagnosis not present

## 2019-12-22 DIAGNOSIS — K760 Fatty (change of) liver, not elsewhere classified: Secondary | ICD-10-CM

## 2019-12-22 LAB — HEPATIC FUNCTION PANEL
ALT: 28 U/L (ref 0–44)
AST: 21 U/L (ref 15–41)
Albumin: 4.4 g/dL (ref 3.5–5.0)
Alkaline Phosphatase: 55 U/L (ref 38–126)
Bilirubin, Direct: 0.1 mg/dL (ref 0.0–0.2)
Indirect Bilirubin: 0.8 mg/dL (ref 0.3–0.9)
Total Bilirubin: 0.9 mg/dL (ref 0.3–1.2)
Total Protein: 7.3 g/dL (ref 6.5–8.1)

## 2019-12-22 NOTE — Assessment & Plan Note (Signed)
Moderate obstructive sleep apnea noted on home sleep study March 2021.  Patient is symptomatic.  Treatment options discussed patient would like proceed with nocturnal CPAP.  Patient education on CPAP discussed. We'll begin auto CPAP 5 to 15 cm H2O.  With mask of choice.  Plan  Patient Instructions  Begin CPAP At bedtime .  Wear all night.  Do not drive if sleepy .  Work on healthy weight loss.  Follow up with Dr. Craige Cotta  Or Siri Buege in 2-3 months and As needed      '

## 2019-12-22 NOTE — Telephone Encounter (Signed)
Per Dr.Piscoya instructed to give patient a call to notify him of recent ultrasound results. "Ultrasound showed no stones were stuck, no infractions, but it did show a fatty liver that patient has had for years. Per Dr.Piscoya wanted [place] to notify patient that he would put in order to have lab work done for Hepatic Liver Function test. Once results are received Dr.Piscoya will contact patient to notify him if he needs additional labs or a referral to [facility]. Patient verbalized understanding and has no further questions.

## 2019-12-22 NOTE — Patient Instructions (Signed)
Begin CPAP At bedtime .  Wear all night.  Do not drive if sleepy .  Work on healthy weight loss.  Follow up with Dr. Craige Cotta  Or Leslie Langille in 2-3 months and As needed

## 2019-12-22 NOTE — Progress Notes (Signed)
12/22/19  Patient's ultrasound and labs reviewed.  Patient has been contacted about the results and new referral will be placed for GI for evaluation.  Henrene Dodge, MD

## 2019-12-22 NOTE — Progress Notes (Signed)
@Patient  ID: Jeff Bray, male    DOB: 1975-12-01, 44 y.o.   MRN: [id number]  Chief Complaint  Patient presents with  . Follow-up    OSA     Referring provider: Jearld Fenton, NP  HPI: 44 year old male seen for sleep consult September 20, 2019 for daytime sleepiness snoring found to have moderate sleep apnea on home sleep study  TEST/EVENTS :  Home sleep study December 13, 2019 AHI 15.7/SPO2 low 80%  12/22/2019 Follow up : OSA  Patient presents for a follow-up.  He was seen for sleep consult December 2020 for daytime sleepiness and snoring.  He was found to have moderate sleep apnea on home sleep study.  AHI 15.7, SPO2 low 80%.  We discussed underlying diagnosis of sleep apnea.  Went over potential treatment options including weight loss, oral appliance and CPAP.  He would like to proceed with CPAP.  Patient education on CPAP given.  Recently had gallbladder surgery recovering.  No Known Allergies  Immunization History  Administered Date(s) Administered  . Moderna SARS-COVID-2 Vaccination 12/16/2019  . Td 09/30/2007  . Tdap 07/23/2017    Past Medical History:  Diagnosis Date  . Anemia    vitamin d deficiency  . Cholelithiasis   . Hypertension   . Medical history non-contributory     Tobacco History: Social History   Tobacco Use  Smoking Status Former Smoker  . Packs/day: 0.25  . Years: 1.00  . Pack years: 0.25  . Types: Cigarettes  . Quit date: 2020  . Years since quitting: 1.2  Smokeless Tobacco Never Used   Counseling given: Not Answered   Outpatient Medications Prior to Visit  Medication Sig Dispense Refill  . Ascorbic Acid (VITAMIN C) 1000 MG tablet Take 1,000 mg by mouth daily.    . Cholecalciferol (VITAMIN D3) 125 MCG (5000 UT) CAPS Take 5,000 Units by mouth daily.     Marland Kitchen ibuprofen (ADVIL) 600 MG tablet Take 1 tablet (600 mg total) by mouth every 8 (eight) hours as needed for mild pain or moderate pain. 30 tablet 1  . Multiple Vitamins-Minerals (DAILY  HEART HEALTH SUPPORT PO) Take 1 tablet by mouth daily.     . NON FORMULARY Take 1 tablet by mouth daily. flucome-immune support     . OVER THE COUNTER MEDICATION Take 1 tablet by mouth daily. "Carditune".  Helps to lower blood pressure    . oxyCODONE (ROXICODONE) 5 MG immediate release tablet Take 1 tablet (5 mg total) by mouth every 6 (six) hours as needed for severe pain. 10 tablet 0  . vitamin k 100 MCG tablet Take 100 mcg by mouth daily.     No facility-administered medications prior to visit.     Review of Systems:   Constitutional:   No  weight loss, night sweats,  Fevers, chills, fatigue, or  lassitude.  HEENT:   No headaches,  Difficulty swallowing,  Tooth/dental problems, or  Sore throat,                No sneezing, itching, ear ache, nasal congestion, post nasal drip,   CV:  No chest pain,  Orthopnea, PND, swelling in lower extremities, anasarca, dizziness, palpitations, syncope.   GI  No heartburn, indigestion, abdominal pain, nausea, vomiting, diarrhea, change in bowel habits, loss of appetite, bloody stools.   Resp: No shortness of breath with exertion or at rest.  No excess mucus, no productive cough,  No non-productive cough,  No coughing up of blood.  No change  in color of mucus.  No wheezing.  No chest wall deformity  Skin: no rash or lesions.  GU: no dysuria, change in color of urine, no urgency or frequency.  No flank pain, no hematuria   MS:  No joint pain or swelling.  No decreased range of motion.  No back pain.    Physical Exam  BP 118/66 (BP Location: Left Arm, Cuff Size: Normal)   Pulse 82   Temp 97.6 F (36.4 C) (Temporal)   Ht 6\' 2"  (1.88 m)   Wt 264 lb (119.7 kg)   SpO2 100% Comment: RA  BMI 33.90 kg/m   GEN: A/Ox3; pleasant , NAD, BMI 33   HEENT:  [place]/AT,    NOSE-clear, THROAT-clear, no lesions, no postnasal drip or exudate noted.  Class II-III MP airway, beard and mustache  NECK:  Supple w/ fair ROM; no JVD; normal carotid impulses w/o  bruits; no thyromegaly or nodules palpated; no lymphadenopathy.    RESP  Clear  P & A; w/o, wheezes/ rales/ or rhonchi. no accessory muscle use, no dullness to percussion  CARD:  RRR, no m/r/g, no peripheral edema, pulses intact, no cyanosis or clubbing.  GI:   Soft & nt; nml bowel sounds; no organomegaly or masses detected.   Musco: Warm bil, no deformities or joint swelling noted.   Neuro: alert, no focal deficits noted.    Skin: Warm, no lesions or rashes    Lab Results:  CBC  No results found for: BNP  ProBNP No results found for: PROBNP  Imaging:     No flowsheet data found.  No results found for: NITRICOXIDE      Assessment & Plan:   OSA (obstructive sleep apnea) Moderate obstructive sleep apnea noted on home sleep study March 2021.  Patient is symptomatic.  Treatment options discussed patient would like proceed with nocturnal CPAP.  Patient education on CPAP discussed. We'll begin auto CPAP 5 to 15 cm H2O.  With mask of choice.  Plan  Patient Instructions  Begin CPAP At bedtime .  Wear all night.  Do not drive if sleepy .  Work on healthy weight loss.  Follow up with Dr. April 2021  Or Montrice Montuori in 2-3 months and As needed      '  Morbid obesity (HCC) Healthy weight loss discussed     Craige Cotta, NP 12/22/2019

## 2019-12-22 NOTE — Assessment & Plan Note (Signed)
Healthy weight loss discussed 

## 2019-12-23 ENCOUNTER — Telehealth: Payer: Self-pay

## 2019-12-23 NOTE — Telephone Encounter (Signed)
Pt went to [facility] ED this morning and has been evaluated

## 2019-12-23 NOTE — Telephone Encounter (Signed)
I don't see that he went to the ER. Can we call and check on him

## 2019-12-23 NOTE — Progress Notes (Signed)
Reviewed and agree with assessment/plan.   Mahmud Keithly, MD [facility] 09/24/2016, 12:24 PM Pager:  [phone]  

## 2019-12-23 NOTE — Telephone Encounter (Signed)
[facility] Day - Client TELEPHONE ADVICE RECORD [facility] Patient Name: Jeff Bray 73 Gender: Male DOB: Nov 22, 1975 Age: 44 Y 10 M 5 D Return Phone Number: [phone] (Primary) Address: City/State/Zip: [place] [address] Client [facility] Day - Client Client Site  [facility] - Day Physician Nicki Reaper - NP Contact Type Call Who Is Calling Patient / Member / Family / Caregiver Call Type Triage / Clinical Relationship To Patient Self Return Phone Number [phone] (Primary) Chief Complaint ABDOMINAL PAIN - Severe and only in abdomen Reason for Call Symptomatic / Request for Health Information Initial Comment Caller states the pt is having severe abdominal pain. GOTO Facility Not Listed [facility] ER. [place], [place] Translation No Nurse Assessment Nurse: Reed Pandy, RN, Amy Date/Time (Eastern Time): 12/23/2019 8:11:07 AM Confirm and document reason for call. If symptomatic, describe symptoms. ---Caller states he's been having severe abdominal pain during the night in the right upper quadrant of his abdomen, had lap chole last Wednesday. Had ultrasound yesterday and saw no bleeding, no stones. Was referred to GI specialist. Has the patient had close contact with a person known or suspected to have the novel coronavirus illness OR traveled / lives in area with major community spread (including international travel) in the last 14 days from the onset of symptoms? * If Asymptomatic, screen for exposure and travel within the last 14 days. ---No Does the patient have any new or worsening symptoms? ---Yes Will a triage be completed? ---Yes Related visit to physician within the last 2 weeks? ---Yes Does the PT have any chronic conditions? (i.e. diabetes, asthma, this includes High risk factors for pregnancy, etc.) ---No Is this a behavioral health or substance abuse call? ---No Guidelines Guideline  Title Affirmed Question Affirmed Notes Nurse Date/Time Lamount Cohen Time) Post-Op Incision Symptoms and Questions Severe pain in the incision Reed Pandy, RN, Amy 12/23/2019 8:14:45 AM Disp. Time Lamount Cohen Time) Disposition Final User 12/23/2019 8:10:16 AM Send to Urgent Elayne Snare PLEASE NOTE: All timestamps contained within this report are represented as [place] Standard Time. CONFIDENTIALTY NOTICE: This fax transmission is intended only for the addressee. It contains information that is legally privileged, confidential or otherwise protected from use or disclosure. If you are not the intended recipient, you are strictly prohibited from reviewing, disclosing, copying using or disseminating any of this information or taking any action in reliance on or regarding this information. If you have received this fax in error, please notify us immediately by telephone so that we can arrange for its return to [place]. Phone: [phone], Toll-Free: [phone], Fax: [phone] Page: 2 of 2 Call Id: [id number] 12/23/2019 8:19:26 AM Go to ED Now (or PCP triage) Yes Reed Pandy, RN, Amy Caller Disagree/Comply Comply Caller Understands Yes PreDisposition Home Care Care Advice Given Per Guideline GO TO ED NOW (OR PCP TRIAGE): * IF NO PCP (PRIMARY CARE PROVIDER) SECOND-LEVEL TRIAGE: You need to be seen within the next hour. Go to the ED/UCC at _____________ Hospital. Leave as soon as you can. ANOTHER ADULT SHOULD DRIVE: * It is better and safer if another adult drives instead of you. CARE ADVICE per Post-Op Incision Symptoms and Questions (Adult) guideline. Referrals GO TO FACILITY OTHER - SPECIFY

## 2019-12-26 ENCOUNTER — Ambulatory Visit: Payer: 59

## 2019-12-26 ENCOUNTER — Encounter: Payer: 59 | Admitting: Surgery

## 2019-12-29 ENCOUNTER — Telehealth: Payer: Self-pay | Admitting: Pulmonary Disease

## 2019-12-29 NOTE — Telephone Encounter (Signed)
ATC pt, no answer. Left message for pt to call back.  

## 2019-12-29 NOTE — Telephone Encounter (Signed)
Patient is calling in about his CPAP machine and his insurance. He states that he will be in between jobs and he wants to know what company will work with him in regards to payments until he gets new insurance. He states that Christoper Allegra says he has to go through a 10 week period and I am unsure what he is referring to. Please help.

## 2019-12-29 NOTE — Telephone Encounter (Signed)
Pt is calling back. Can be reached at  [phone]

## 2020-01-02 ENCOUNTER — Telehealth: Payer: Self-pay | Admitting: Internal Medicine

## 2020-01-02 NOTE — Telephone Encounter (Signed)
Patient called today. He stated that when he went to the [facility] hospital they advised he see a GI specialist. Patient stated that they found ulcers  He thought the hospital took care of the referral but spoke with [facility] they advised he call his PCP to get a referral to see them  Patient stated he is in a time crunch because his insurance will run out in MAY  Advised patient that Nicki Reaper is out of office this week but I would send over to see if we can help get this taken care of. Please advise

## 2020-01-02 NOTE — Telephone Encounter (Signed)
error 

## 2020-01-02 NOTE — Telephone Encounter (Signed)
This was the response from the DMe company:  Durward Fortes sent to Jeff Bray,   We pulled this pap order and called the patient. We went over the pap rental with him, and he said that was not going to work for him because he was told he would only be on the pap for 4 weeks to correct his sleep apnea. He said he travels too much and this was not what he expected to be on the unit for extended period of time/life. He thought he would only use it for 4 weeks and that would be it. He said he would be calling your office to discuss. We have our order on hold until we hear back from him or your office.   Thank you,   Verlon Au

## 2020-01-02 NOTE — Telephone Encounter (Signed)
He needs to schedule a hospital followup and referral can be placed at that time.

## 2020-01-02 NOTE — Telephone Encounter (Signed)
Spoke with the pt  He states that this is not what he told [employer]  He states from what he understood, after 4 wks is when the insurance would cover his CPAP  He wants to use the CPAP for years, not just 4 wks  Now he wants to switch companies from [place]- please sent his order somewhere else  He would like the [facility] to call him and inform him where the order was sent so he can call them to discuss  He also states that he has his own CPAP and wonders if they can just put the settings on his machine  Forwarding back to [facility]

## 2020-01-03 NOTE — Telephone Encounter (Signed)
Sent to [employer].  Left detailed message on pt's VM w/ [employer] # & my number if he has questions.

## 2020-01-10 ENCOUNTER — Ambulatory Visit: Payer: 59 | Admitting: Internal Medicine

## 2020-01-10 ENCOUNTER — Inpatient Hospital Stay: Payer: 59 | Admitting: Internal Medicine

## 2020-01-10 DIAGNOSIS — Z0289 Encounter for other administrative examinations: Secondary | ICD-10-CM

## 2020-01-10 NOTE — Progress Notes (Deleted)
Subjective:    Patient ID: Jeff Bray, male    DOB: [birth year], 44 y.o.   MRN: [id number]  HPI  Pt presents to the clinic today for ER followup. He went to [facility] 3/26 with c/o abdominal pain. Chest xray and labs were unremarkable. ECG showed T wave inversion, d dimer was negative. He was treated with Maalox and Protonix. He was diagnosed with probable gastritis, treated with Protonix and Carafate. He was advised to follow up with GI, but reports they did not refer him and he is requesting referral today. Since discharge,  Review of Systems      Past Medical History:  Diagnosis Date  . Anemia    vitamin d deficiency  . Cholelithiasis   . Hypertension   . Medical history non-contributory     Current Outpatient Medications  Medication Sig Dispense Refill  . Ascorbic Acid (VITAMIN C) 1000 MG tablet Take 1,000 mg by mouth daily.    . Cholecalciferol (VITAMIN D3) 125 MCG (5000 UT) CAPS Take 5,000 Units by mouth daily.     Marland Kitchen ibuprofen (ADVIL) 600 MG tablet Take 1 tablet (600 mg total) by mouth every 8 (eight) hours as needed for mild pain or moderate pain. 30 tablet 1  . Multiple Vitamins-Minerals (DAILY HEART HEALTH SUPPORT PO) Take 1 tablet by mouth daily.     . NON FORMULARY Take 1 tablet by mouth daily. flucome-immune support     . OVER THE COUNTER MEDICATION Take 1 tablet by mouth daily. "Carditune".  Helps to lower blood pressure    . oxyCODONE (ROXICODONE) 5 MG immediate release tablet Take 1 tablet (5 mg total) by mouth every 6 (six) hours as needed for severe pain. 10 tablet 0  . vitamin k 100 MCG tablet Take 100 mcg by mouth daily.     No current facility-administered medications for this visit.    No Known Allergies  Family History  Problem Relation Age of Onset  . Melanoma Mother   . Bladder Cancer Father   . Diabetes Neg Hx   . Hypertension Neg Hx   . Hyperlipidemia Neg Hx   . Stroke Neg Hx     Social History   Socioeconomic History  . Marital  status: Significant Other    Spouse name: gasmaine  . Number of children: Not on file  . Years of education: Not on file  . Highest education level: Not on file  Occupational History  . Occupation: does more IT work  Tobacco Use  . Smoking status: Former Smoker    Packs/day: 0.25    Years: 1.00    Pack years: 0.25    Types: Cigarettes    Quit date: 2020    Years since quitting: 1.2  . Smokeless tobacco: Never Used  Substance and Sexual Activity  . Alcohol use: Yes    Alcohol/week: 6.0 standard drinks    Types: 6 Glasses of wine per week    Comment: occasional  . Drug use: No    Comment: nothing in over a year  . Sexual activity: Yes  Other Topics Concern  . Not on file  Social History Narrative   Patient lives with girlfriend and 2 room mates.   Social Determinants of Health   Financial Resource Strain:   . Difficulty of Paying Living Expenses:   Food Insecurity:   . Worried About Programme researcher, broadcasting/film/video in the Last Year:   . Barista in the Last Year:   [employer]  Needs:   . Lack of Transportation (Medical):   Marland Kitchen Lack of Transportation (Non-Medical):   Physical Activity:   . Days of Exercise per Week:   . Minutes of Exercise per Session:   Stress:   . Feeling of Stress :   Social Connections:   . Frequency of Communication with Friends and Family:   . Frequency of Social Gatherings with Friends and Family:   . Attends Religious Services:   . Active Member of Clubs or Organizations:   . Attends Banker Meetings:   Marland Kitchen Marital Status:   Intimate Partner Violence:   . Fear of Current or Ex-Partner:   . Emotionally Abused:   Marland Kitchen Physically Abused:   . Sexually Abused:      Constitutional: Denies fever, malaise, fatigue, headache or abrupt weight changes.  HEENT: Denies eye pain, eye redness, ear pain, ringing in the ears, wax buildup, runny nose, nasal congestion, bloody nose, or sore throat. Respiratory: Denies difficulty breathing, shortness  of breath, cough or sputum production.   Cardiovascular: Denies chest pain, chest tightness, palpitations or swelling in the hands or feet.  Gastrointestinal: Denies abdominal pain, bloating, constipation, diarrhea or blood in the stool.  GU: Denies urgency, frequency, pain with urination, burning sensation, blood in urine, odor or discharge. Musculoskeletal: Denies decrease in range of motion, difficulty with gait, muscle pain or joint pain and swelling.  Skin: Denies redness, rashes, lesions or ulcercations.  Neurological: Denies dizziness, difficulty with memory, difficulty with speech or problems with balance and coordination.  Psych: Denies anxiety, depression, SI/HI.  No other specific complaints in a complete review of systems (except as listed in HPI above).  Objective:   Physical Exam   There were no vitals taken for this visit. Wt Readings from Last 3 Encounters:  12/22/19 264 lb (119.7 kg)  12/21/19 264 lb 9.6 oz (120 kg)  12/01/19 264 lb 15.9 oz (120.2 kg)    General: Appears their stated age, well developed, well nourished in NAD. Skin: Warm, dry and intact. No rashes, lesions or ulcerations noted. HEENT: Head: normal shape and size; Eyes: sclera white, no icterus, conjunctiva pink, PERRLA and EOMs intact; Ears: Tm's gray and intact, normal light reflex; Nose: mucosa pink and moist, septum midline; Throat/Mouth: Teeth present, mucosa pink and moist, no exudate, lesions or ulcerations noted.  Neck:  Neck supple, trachea midline. No masses, lumps or thyromegaly present.  Cardiovascular: Normal rate and rhythm. S1,S2 noted.  No murmur, rubs or gallops noted. No JVD or BLE edema. No carotid bruits noted. Pulmonary/Chest: Normal effort and positive vesicular breath sounds. No respiratory distress. No wheezes, rales or ronchi noted.  Abdomen: Soft and nontender. Normal bowel sounds. No distention or masses noted. Liver, spleen and kidneys non palpable. Musculoskeletal: Normal  range of motion. No signs of joint swelling. No difficulty with gait.  Neurological: Alert and oriented. Cranial nerves II-XII grossly intact. Coordination normal.  Psychiatric: Mood and affect normal. Behavior is normal. Judgment and thought content normal.   EKG:  BMET    Component Value Date/Time   NA 135 12/01/2019 1655   K 3.9 12/01/2019 1655   CL 101 12/01/2019 1655   CO2 31 12/01/2019 1655   GLUCOSE 99 12/01/2019 1655   BUN 13 12/01/2019 1655   CREATININE 0.84 12/01/2019 1655   CREATININE 0.81 10/01/2018 1543   CALCIUM 9.6 12/01/2019 1655   GFRNONAA >60 12/01/2019 1655   GFRAA >60 12/01/2019 1655    Lipid Panel  Component Value Date/Time   CHOL 182 10/01/2018 1543   TRIG 315 (H) 10/01/2018 1543   HDL 41 10/01/2018 1543   CHOLHDL 4.4 10/01/2018 1543   VLDL 23.2 01/05/2018 0924   LDLCALC 99 10/01/2018 1543    CBC    Component Value Date/Time   WBC 10.3 12/01/2019 1655   RBC 5.42 12/01/2019 1655   HGB 16.6 12/01/2019 1655   HCT 47.8 12/01/2019 1655   PLT 273 12/01/2019 1655   MCV 88.2 12/01/2019 1655   MCH 30.6 12/01/2019 1655   MCHC 34.7 12/01/2019 1655   RDW 12.2 12/01/2019 1655    Hgb A1C Lab Results  Component Value Date   HGBA1C 5.3 10/01/2018           Assessment & Plan:   ER Follow Up for Abdominal Pain, Gastritis:  ER notes, labs and imaging reviewed.  Webb Silversmith, NP This visit occurred during the SARS-CoV-2 public health emergency.  Safety protocols were in place, including screening questions prior to the visit, additional usage of staff PPE, and extensive cleaning of exam room while observing appropriate contact time as indicated for disinfecting solutions.

## 2020-01-11 ENCOUNTER — Ambulatory Visit: Payer: 59 | Admitting: Internal Medicine

## 2020-01-12 ENCOUNTER — Ambulatory Visit (INDEPENDENT_AMBULATORY_CARE_PROVIDER_SITE_OTHER): Payer: 59 | Admitting: Internal Medicine

## 2020-01-12 ENCOUNTER — Other Ambulatory Visit: Payer: Self-pay

## 2020-01-12 ENCOUNTER — Encounter: Payer: Self-pay | Admitting: Internal Medicine

## 2020-01-12 VITALS — BP 140/88 | HR 86 | Temp 97.8°F | Wt 265.0 lb

## 2020-01-12 DIAGNOSIS — K2900 Acute gastritis without bleeding: Secondary | ICD-10-CM | POA: Diagnosis not present

## 2020-01-12 MED ORDER — PANTOPRAZOLE SODIUM 20 MG PO TBEC: 20.0000 mg | DELAYED_RELEASE_TABLET | Freq: Every day | ORAL | 2 refills | Status: DC

## 2020-01-12 NOTE — Patient Instructions (Signed)
Gastritis, Adult  Gastritis is swelling (inflammation) of the stomach. Gastritis can develop quickly (acute). It can also develop slowly over time (chronic). It is important to get help for this condition. If you do not get help, your stomach can bleed, and you can get sores (ulcers) in your stomach. What are the causes? This condition may be caused by:  Germs that get to your stomach.  Drinking too much alcohol.  Medicines you are taking.  Too much acid in the stomach.  A disease of the intestines or stomach.  Stress.  An allergic reaction.  Crohn's disease.  Some cancer treatments (radiation). Sometimes the cause of this condition is not known. What are the signs or symptoms? Symptoms of this condition include:  Pain in your stomach.  A burning feeling in your stomach.  Feeling sick to your stomach (nauseous).  Throwing up (vomiting).  Feeling too full after you eat.  Weight loss.  Bad breath.  Throwing up blood.  Blood in your poop (stool). How is this diagnosed? This condition may be diagnosed with:  Your medical history and symptoms.  A physical exam.  Tests. These can include: ? Blood tests. ? Stool tests. ? A procedure to look inside your stomach (upper endoscopy). ? A test in which a sample of tissue is taken for testing (biopsy). How is this treated? Treatment for this condition depends on what caused it. You may be given:  Antibiotic medicine, if your condition was caused by germs.  H2 blockers and similar medicines, if your condition was caused by too much acid. Follow these instructions at home: Medicines  Take over-the-counter and prescription medicines only as told by your doctor.  If you were prescribed an antibiotic medicine, take it as told by your doctor. Do not stop taking it even if you start to feel better. Eating and drinking   Eat small meals often, instead of large meals.  Avoid foods and drinks that make your symptoms  worse.  Drink enough fluid to keep your pee (urine) pale yellow. Alcohol use  Do not drink alcohol if: ? Your doctor tells you not to drink. ? You are pregnant, may be pregnant, or are planning to become pregnant.  If you drink alcohol: ? Limit your use to:  0-1 drink a day for women.  0-2 drinks a day for men. ? Be aware of how much alcohol is in your drink. In the [place] one drink equals one 12 oz bottle of beer (355 mL), one 5 oz glass of wine (148 mL), or one 1 oz glass of hard liquor (44 mL). General instructions  Talk with your doctor about ways to manage stress. You can exercise or do deep breathing, meditation, or yoga.  Do not smoke or use products that have nicotine or tobacco. If you need help quitting, ask your doctor.  Keep all follow-up visits as told by your doctor. This is important. Contact a doctor if:  Your symptoms get worse.  Your symptoms go away and then come back. Get help right away if:  You throw up blood or something that looks like coffee grounds.  You have black or dark red poop.  You throw up any time you try to drink fluids.  Your stomach pain gets worse.  You have a fever.  You do not feel better after one week. Summary  Gastritis is swelling (inflammation) of the stomach.  You must get help for this condition. If you do not get help, your stomach   can bleed, and you can get sores (ulcers).  This condition is diagnosed with medical history, physical exam, or tests.  You can be treated with medicines for germs or medicines to block too much acid in your stomach. This information is not intended to replace advice given to you by your health care provider. Make sure you discuss any questions you have with your health care provider. Document Revised: 02/02/2018 Document Reviewed: 02/02/2018 Elsevier Patient Education  2020 Elsevier Inc.  

## 2020-01-12 NOTE — Progress Notes (Signed)
Subjective:    Patient ID: Jeff Bray, male    DOB: June 30, 1976, 44 y.o.   MRN: [id number]  HPI  Patient presents to the clinic today for ER follow-up.  He went to the on 3/26 with complaint of abdominal pain.  Labs were unremarkable.  Chest x-ray was negative.  He was diagnosed with gastritis, given Pantoprazole and Maalox in the ER.  He was discharged with prescriptions for Pantoprazole and Carafate.  He has been taking the medication as prescribed with great relief.  He wanted a referral to GI but does not feel like he needs it. He denies NSAID's or excessive ETOH use.   Review of Systems      Past Medical History:  Diagnosis Date  . Anemia    vitamin d deficiency  . Cholelithiasis   . Hypertension   . Medical history non-contributory     Current Outpatient Medications  Medication Sig Dispense Refill  . Ascorbic Acid (VITAMIN C) 1000 MG tablet Take 1,000 mg by mouth daily.    . Cholecalciferol (VITAMIN D3) 125 MCG (5000 UT) CAPS Take 5,000 Units by mouth daily.     Marland Kitchen ibuprofen (ADVIL) 600 MG tablet Take 1 tablet (600 mg total) by mouth every 8 (eight) hours as needed for mild pain or moderate pain. 30 tablet 1  . Multiple Vitamins-Minerals (DAILY HEART HEALTH SUPPORT PO) Take 1 tablet by mouth daily.     . NON FORMULARY Take 1 tablet by mouth daily. flucome-immune support     . OVER THE COUNTER MEDICATION Take 1 tablet by mouth daily. "Carditune".  Helps to lower blood pressure    . oxyCODONE (ROXICODONE) 5 MG immediate release tablet Take 1 tablet (5 mg total) by mouth every 6 (six) hours as needed for severe pain. 10 tablet 0  . vitamin k 100 MCG tablet Take 100 mcg by mouth daily.     No current facility-administered medications for this visit.    No Known Allergies  Family History  Problem Relation Age of Onset  . Melanoma Mother   . Bladder Cancer Father   . Diabetes Neg Hx   . Hypertension Neg Hx   . Hyperlipidemia Neg Hx   . Stroke Neg Hx     Social History     Socioeconomic History  . Marital status: Significant Other    Spouse name: gasmaine  . Number of children: Not on file  . Years of education: Not on file  . Highest education level: Not on file  Occupational History  . Occupation: does more IT work  Tobacco Use  . Smoking status: Former Smoker    Packs/day: 0.25    Years: 1.00    Pack years: 0.25    Types: Cigarettes    Quit date: 2020    Years since quitting: 1.2  . Smokeless tobacco: Never Used  Substance and Sexual Activity  . Alcohol use: Yes    Alcohol/week: 6.0 standard drinks    Types: 6 Glasses of wine per week    Comment: occasional  . Drug use: No    Comment: nothing in over a year  . Sexual activity: Yes  Other Topics Concern  . Not on file  Social History Narrative   Patient lives with girlfriend and 2 room mates.   Social Determinants of Health   Financial Resource Strain:   . Difficulty of Paying Living Expenses:   Food Insecurity:   . Worried About Charity fundraiser in the Last Year:   .  Ran Out of Food in the Last Year:   Transportation Needs:   . Freight forwarder (Medical):   Marland Kitchen Lack of Transportation (Non-Medical):   Physical Activity:   . Days of Exercise per Week:   . Minutes of Exercise per Session:   Stress:   . Feeling of Stress :   Social Connections:   . Frequency of Communication with Friends and Family:   . Frequency of Social Gatherings with Friends and Family:   . Attends Religious Services:   . Active Member of Clubs or Organizations:   . Attends Banker Meetings:   Marland Kitchen Marital Status:   Intimate Partner Violence:   . Fear of Current or Ex-Partner:   . Emotionally Abused:   Marland Kitchen Physically Abused:   . Sexually Abused:      Constitutional: Denies fever, malaise, fatigue, headache or abrupt weight changes.  Respiratory: Denies difficulty breathing, shortness of breath, cough or sputum production.   Cardiovascular: Denies chest pain, chest tightness,  palpitations or swelling in the hands or feet.  Gastrointestinal: Denies abdominal pain, bloating, constipation, diarrhea or blood in the stool.     No other specific complaints in a complete review of systems (except as listed in HPI above).  Objective:   Physical Exam  BP 140/88   Pulse 86   Temp 97.8 F (36.6 C) (Temporal)   Wt 265 lb (120.2 kg)   SpO2 98%   BMI 34.02 kg/m    Wt Readings from Last 3 Encounters:  12/22/19 264 lb (119.7 kg)  12/21/19 264 lb 9.6 oz (120 kg)  12/01/19 264 lb 15.9 oz (120.2 kg)    General: Appears his stated age, obese, in NAD. Cardiovascular: Normal rate and rhythm. S1,S2 noted.  No murmur, rubs or gallops noted.  Pulmonary/Chest: Normal effort and positive vesicular breath sounds. No respiratory distress. No wheezes, rales or ronchi noted.  Abdomen: Soft and nontender. Normal bowel sounds. No distention or masses noted.  Neurological: Alert and oriented.    BMET    Component Value Date/Time   NA 135 12/01/2019 1655   K 3.9 12/01/2019 1655   CL 101 12/01/2019 1655   CO2 31 12/01/2019 1655   GLUCOSE 99 12/01/2019 1655   BUN 13 12/01/2019 1655   CREATININE 0.84 12/01/2019 1655   CREATININE 0.81 10/01/2018 1543   CALCIUM 9.6 12/01/2019 1655   GFRNONAA >60 12/01/2019 1655   GFRAA >60 12/01/2019 1655    Lipid Panel     Component Value Date/Time   CHOL 182 10/01/2018 1543   TRIG 315 (H) 10/01/2018 1543   HDL 41 10/01/2018 1543   CHOLHDL 4.4 10/01/2018 1543   VLDL 23.2 01/05/2018 0924   LDLCALC 99 10/01/2018 1543    CBC    Component Value Date/Time   WBC 10.3 12/01/2019 1655   RBC 5.42 12/01/2019 1655   HGB 16.6 12/01/2019 1655   HCT 47.8 12/01/2019 1655   PLT 273 12/01/2019 1655   MCV 88.2 12/01/2019 1655   MCH 30.6 12/01/2019 1655   MCHC 34.7 12/01/2019 1655   RDW 12.2 12/01/2019 1655    Hgb A1C Lab Results  Component Value Date   HGBA1C 5.3 10/01/2018           Assessment & Plan:   ER Follow Up for  Gastritis:  ER notes, labs and imaging reviewed Continue Pantoprazole BID x 2 weeks then decrease to 1 tab daily Continue Carafate as needed only He declines referral to GI  Return  precautions discussed Nicki Reaper, NP This visit occurred during the SARS-CoV-2 public health emergency.  Safety protocols were in place, including screening questions prior to the visit, additional usage of staff PPE, and extensive cleaning of exam room while observing appropriate contact time as indicated for disinfecting solutions.

## 2020-01-24 ENCOUNTER — Telehealth: Payer: Self-pay | Admitting: Internal Medicine

## 2020-01-24 MED ORDER — SUCRALFATE 1 G PO TABS: 1.0000 g | ORAL_TABLET | Freq: Three times a day (TID) | ORAL | 0 refills | Status: DC

## 2020-01-24 MED ORDER — PANTOPRAZOLE SODIUM 20 MG PO TBEC: 20.0000 mg | DELAYED_RELEASE_TABLET | Freq: Every day | ORAL | 0 refills | Status: AC

## 2020-01-24 NOTE — Telephone Encounter (Signed)
RX sent to pharmacy  

## 2020-01-24 NOTE — Telephone Encounter (Signed)
Pt is requesting Rx be sent for Carafate 1g and Pantoprazole 20mg  Per records on 01/12/20 Pantoprazole 20mg  was sent in #30 x 2 refills but [facility] did not receive this.   Pt is requesting 90 day supply of both.   Please advise, thanks.

## 2020-02-16 ENCOUNTER — Other Ambulatory Visit: Payer: Self-pay | Admitting: Internal Medicine

## 2020-02-29 ENCOUNTER — Other Ambulatory Visit: Payer: Self-pay | Admitting: Internal Medicine

## 2020-03-01 NOTE — Telephone Encounter (Signed)
Please advise if okay to refill 90 day supply

## 2020-03-02 NOTE — Telephone Encounter (Signed)
Can you check with him. I don't think he is using this consistently.

## 2020-11-08 IMAGING — US US ABDOMEN LIMITED
2 series · 13 of 25 positions shown · non-contrast
Comparison: January 05, 2018

CLINICAL DATA: Right upper quadrant pain.

EXAM:
ULTRASOUND ABDOMEN LIMITED RIGHT UPPER QUADRANT

[Series 1: us abdomen limited ruq · 9 of 35 slices shown]
[im 1/35]
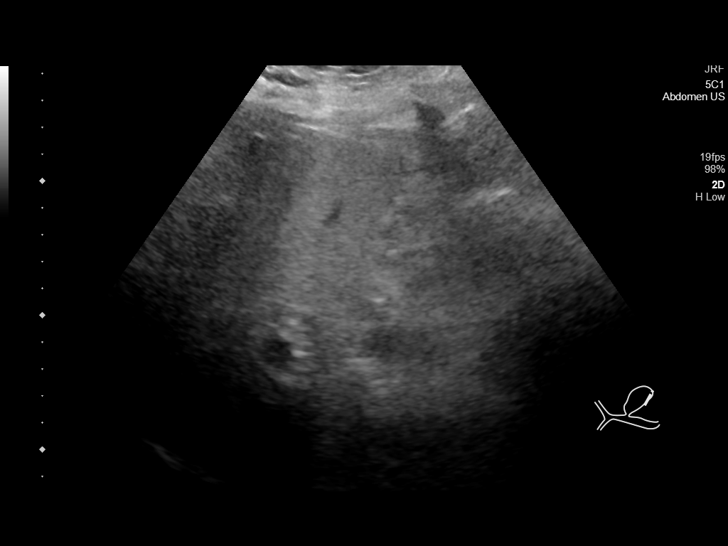
[im 5/35]
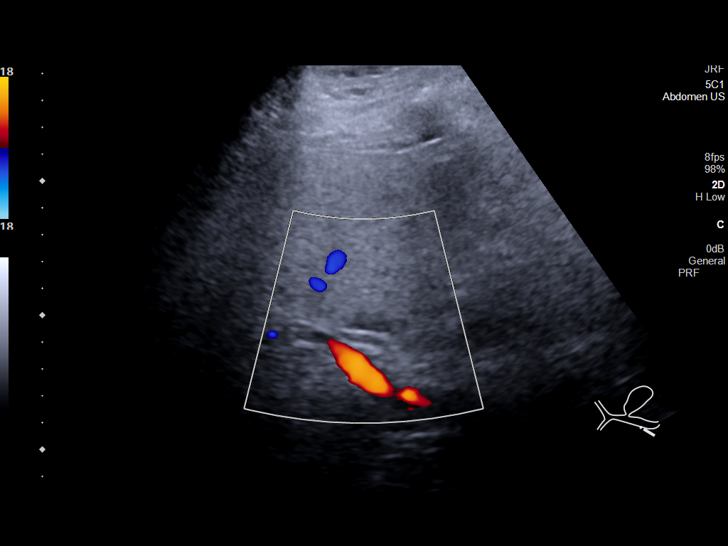
[im 9/35]
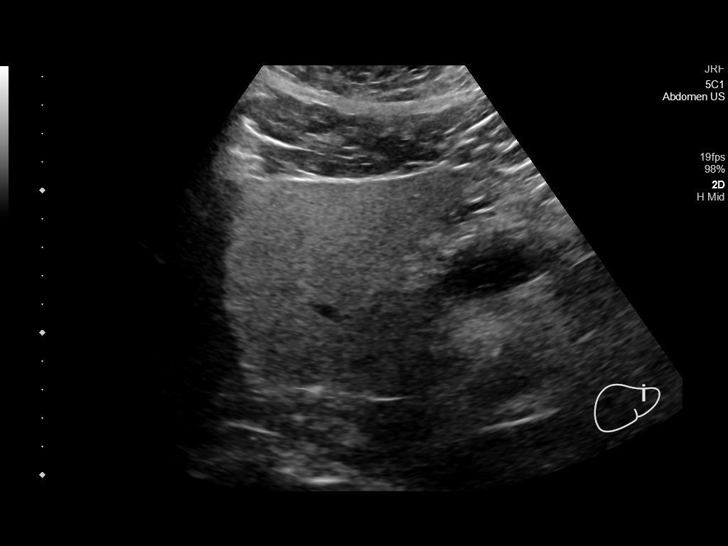
[im 13/35]
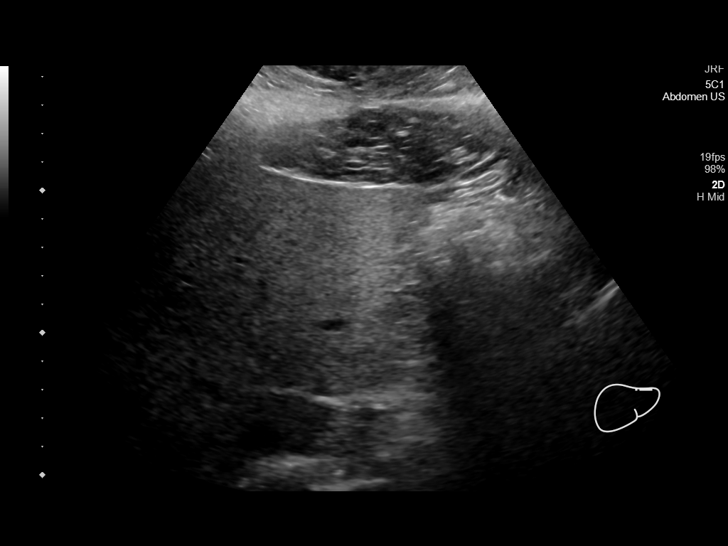
[im 18/35]
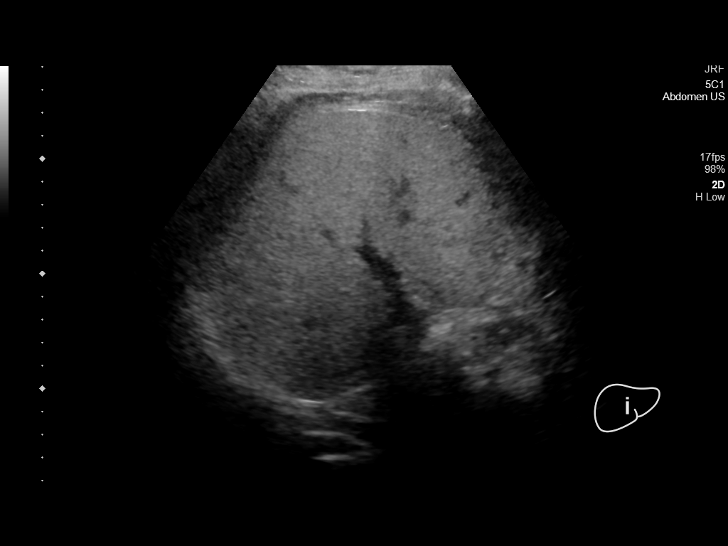
[im 22/35]
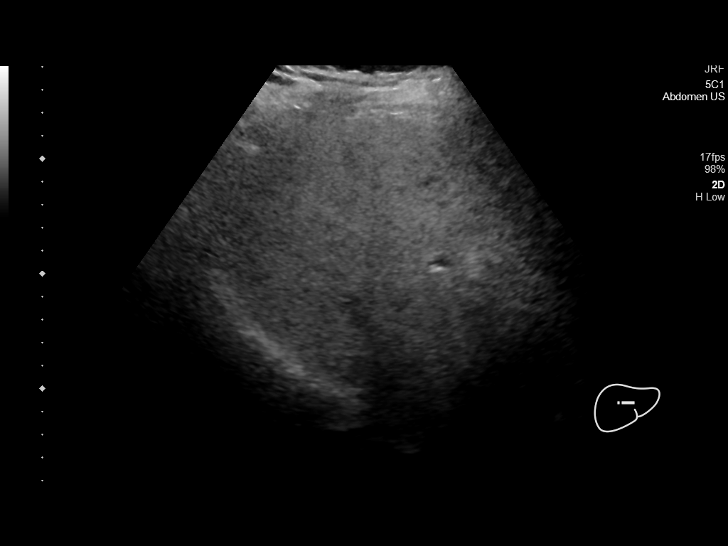
[im 26/35]
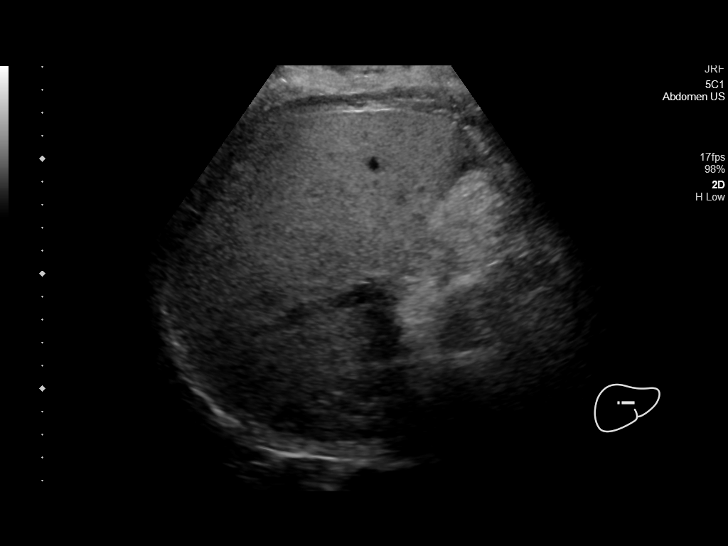
[im 30/35]
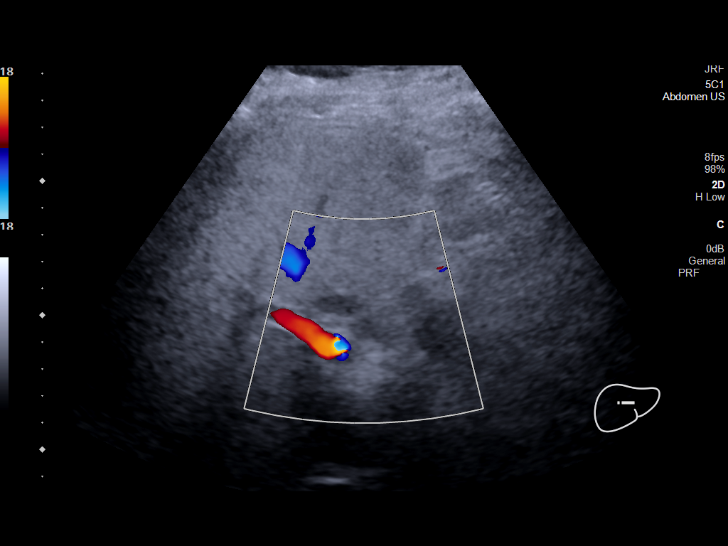
[im 35/35]
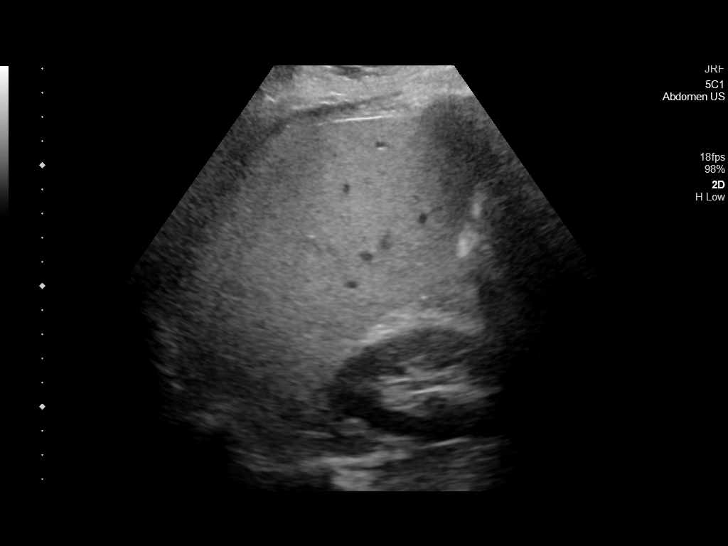

[Series 1001: abdomen us · 4 of 16 slices shown]
[im 3/16]
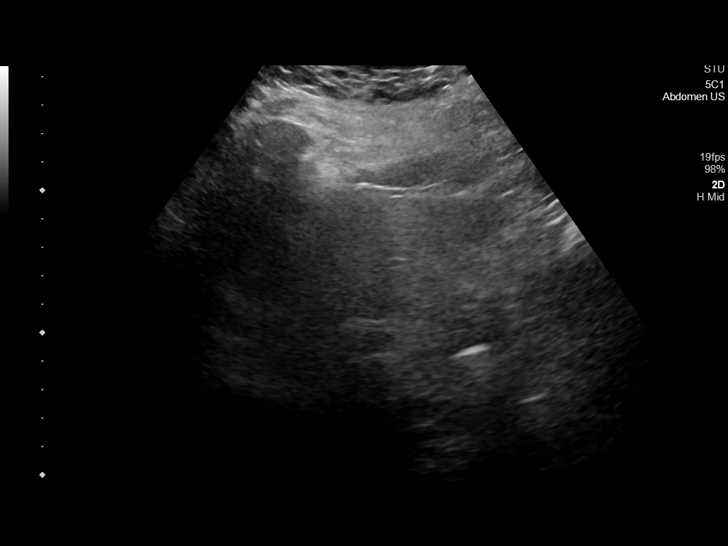
[im 7/16]
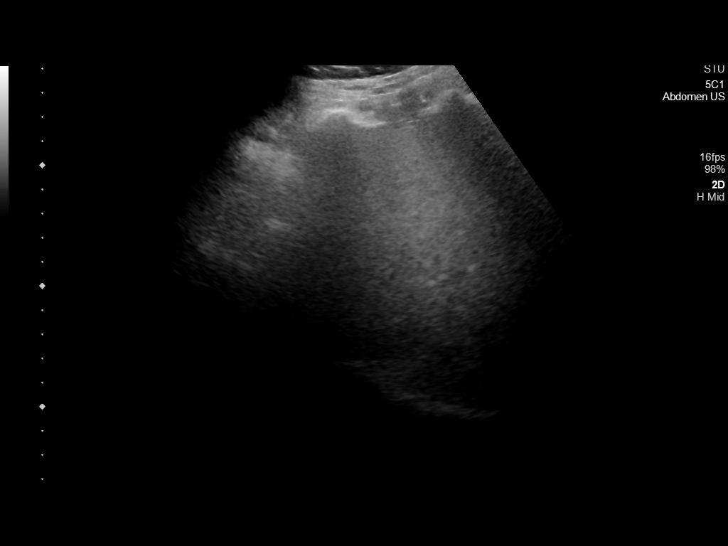
[im 11/16]
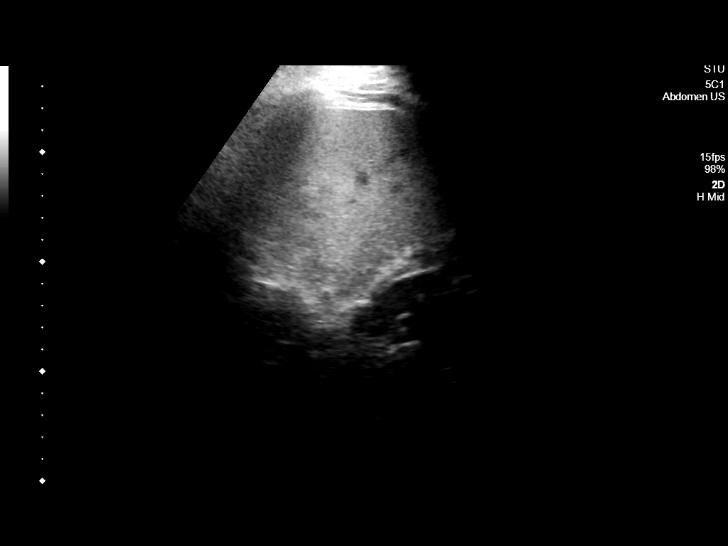
[im 16/16]
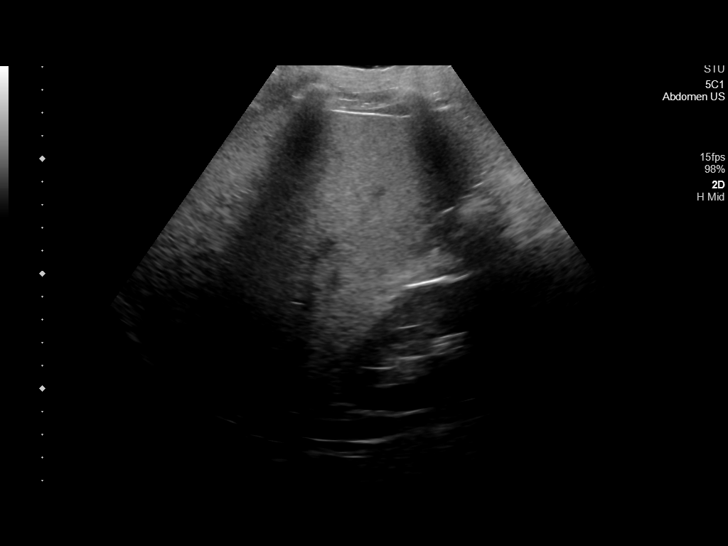

[13 of 25 positions shown; findings below may reference images not displayed]

FINDINGS: Gallbladder:

Surgically absent.

Common bile duct:

Diameter: 3 mm. No intrahepatic or extrahepatic biliary duct
dilatation.

Liver:

Liver echogenicity is increased diffusely. A focal area of decreased
echogenicity is noted in the right lobe near the common hepatic duct
measuring 2.2 x 1.2 x 2.3 cm. No other liver lesion evident. Portal
vein is patent on color Doppler imaging with normal direction of
blood flow towards the liver.

Other: None.
IMPRESSION: 1.  Gallbladder absent.

2. Diffuse increase in liver echogenicity, a finding indicative of
hepatic steatosis. A focal area of decreased attenuation in the
right lobe of the liver near the hepatic duct measures 2.2 x 1.2 x
2.3 cm. This focus may represent fatty sparing. It is somewhat more
well-defined than is generally seen with fatty infiltration. This
finding may warrant a follow-up ultrasound of the liver in 6 months
to assess for stability. No other potential focal liver lesions
evident. Note that the sensitivity of ultrasound for detection of
focal liver lesions is somewhat diminished given the degree of
underlying apparent hepatic steatosis.

## 2021-11-28 ENCOUNTER — Telehealth: Payer: Self-pay | Admitting: Pulmonary Disease

## 2021-11-29 NOTE — Telephone Encounter (Signed)
Called and LVM in regards to scheduling an appt for Dr Craige Cotta so we can renew CPAP orders as its been over a year since he was last seen.  ?

## 2021-12-03 NOTE — Telephone Encounter (Signed)
send it because nothing has changed. Explained I would send another message but that he would still need an appointment. Pt insisted on just sending it in.  ?

## 2021-12-03 NOTE — Telephone Encounter (Signed)
Called patient and he states that he needs a new cpap machine order because he couldn't afford it a year and a half ago. He is willing to make an appt if it is virtual because he lives almost 2 hours away.  ? ?Dr Craige Cotta would you be willing to see this patient virtually? Or schedule with an APP? ? ?Please advise  ?

## 2021-12-03 NOTE — Telephone Encounter (Signed)
Okay to schedule video visit with me or APP. ?

## 2021-12-04 NOTE — Telephone Encounter (Signed)
Called patient but he did not answer. Left message for him to call back.  

## 2021-12-30 ENCOUNTER — Ambulatory Visit: Payer: 59 | Admitting: Dermatology

## 2022-01-07 NOTE — Telephone Encounter (Signed)
Routing to front desk pool to try to get pt scheduled for an appt. ?

## 2022-01-31 ENCOUNTER — Encounter: Payer: Self-pay | Admitting: Pulmonary Disease

## 2022-01-31 ENCOUNTER — Telehealth (INDEPENDENT_AMBULATORY_CARE_PROVIDER_SITE_OTHER): Payer: Self-pay | Admitting: Pulmonary Disease

## 2022-01-31 VITALS — Ht 74.0 in | Wt 269.0 lb

## 2022-01-31 DIAGNOSIS — G4733 Obstructive sleep apnea (adult) (pediatric): Secondary | ICD-10-CM

## 2022-01-31 NOTE — Progress Notes (Signed)
? ?[facility] ? ?Chief Complaint  ?Patient presents with  ? Follow-up  ?  F/U on cpap machine. States he is interested in a cpap machine. Was not able to get machine a few years ago due to insurance.   ? ? ?Past Surgical History:  ?He  has a past surgical history that includes No past surgeries and Cholecystectomy (N/A, 12/07/2019). ? ?Past Medical History:  ?Vit D deficiency, Cholelithiasis, HTN, GERD ? ?Constitutional:  ?Ht 6\' 2"  (1.88 m)   Wt 269 lb (122 kg)   BMI 34.54 kg/m?  ? ?Brief Summary:  ?Jeff Bray is a 46 y.o. male former smoker with obstructive sleep apnea. ?  ? ? ? ?Subjective:  ?Virtual Visit via Video Note ? ?I connected with Jeff Bray on 01/31/22 at  1:30 PM EDT by a video enabled telemedicine application and verified that I am speaking with the correct person using two identifiers. ? ?Location: ?Patient: home ?Provider: medical office ?  ?I discussed the limitations of evaluation and management by telemedicine and the availability of in person appointments. The patient expressed understanding and agreed to proceed. ? ?History of Present Illness: ? ?I last saw him in December 2020.  He had home sleep study in March 2021.  Showed moderate sleep apnea.  He was to get CPAP set up, but wasn't able to due to insurance restrictions.  He has different insurance now. ? ?He goes to bed between 9 and 11.  Falls asleep easily.  Wakes up snoring.  Restless at night.  Gets out of bed at 7 am.  Tired during the day and has to drink coffee in the afternoon.  He and his wife are expecting  their first child later this Summer. ? ? ?Physical Exam:  ?Pleasant, no respiratory distress, speech fluent. ?  ?Sleep Tests:  ?HST 12/13/19 >> AHI 15.7, SpO2 low 80% ? ?Social History:  ?He  reports that he quit smoking about 3 years ago. His smoking use included cigarettes. He has a 0.25 pack-year smoking history. He has never used smokeless tobacco. He reports current alcohol use of  about 6.0 standard drinks per week. He reports that he does not use drugs. ? ?Family History:  ?His family history includes Bladder Cancer in his father; Melanoma in his mother. ?  ? ? ?Assessment/Plan:  ? ?Obstructive sleep apnea. ?- reviewed his sleep study ?- will arrange for auto CPAP set up ?- discussed options to help adjusting to CPAP use ? ?Obesity. ?- he was recently started on wegovy ?- if he makes good progress then might be able to repeat his sleep study to see if he still has sleep apnea ? ?Time Spent Involved in Patient Care on Day of Examination:  ? ?I discussed the assessment and treatment plan with the patient. The patient was provided an opportunity to ask questions and all were answered. The patient agreed with the plan and demonstrated an understanding of the instructions. ?  ?The patient was advised to call back or seek an in-person evaluation if the symptoms worsen or if the condition fails to improve as anticipated. ? ?I provided 27 minutes of non-face-to-face time during this encounter. ? ? ?Follow up:  ? ?Patient Instructions  ?Will arrange for auto CPAP set up ? ?Follow up in 4 months ? ?Medication List:  ? ?Allergies as of 01/31/2022   ?No Known Allergies ?  ? ?  ?Medication List  ?  ? ?  ? Accurate as  of Jan 31, 2022  2:04 PM. If you have any questions, ask your nurse or doctor.  ?  ?  ? ?  ? ?DAILY HEART HEALTH SUPPORT PO ?Take 1 tablet by mouth daily. ?  ?ibuprofen 600 MG tablet ?Commonly known as: ADVIL ?Take 1 tablet (600 mg total) by mouth every 8 (eight) hours as needed for mild pain or moderate pain. ?  ?NON FORMULARY ?Take 1 tablet by mouth daily. flucome-immune support ?  ?OVER THE COUNTER MEDICATION ?Take 1 tablet by mouth daily. "Carditune".  Helps to lower blood pressure ?  ?pantoprazole 20 MG tablet ?Commonly known as: Protonix ?Take 1 tablet (20 mg total) by mouth daily. ?  ?sucralfate 1 g tablet ?Commonly known as: CARAFATE ?TAKE 1 TABLET (1 G TOTAL) BY MOUTH 4 (FOUR) TIMES  DAILY - WITH MEALS AND AT BEDTIME. ?  ?vitamin C 1000 MG tablet ?Take 1,000 mg by mouth daily. ?  ?Vitamin D3 125 MCG (5000 UT) Caps ?Take 5,000 Units by mouth daily. ?  ?vitamin k 100 MCG tablet ?Take 100 mcg by mouth daily. ?  ?Wegovy 0.5 MG/0.5ML Soaj ?Generic drug: Semaglutide-Weight Management ?Inject into the skin. ?  ? ?  ? ? ?Signature:  ?Coralyn Helling, MD ?[facility] ?Pager - [phone] ?01/31/2022, 2:04 PM ?  ? ? ? ? ? ? ? ? ?

## 2022-01-31 NOTE — Patient Instructions (Signed)
Will arrange for auto CPAP set up  Follow up in 4 months 

## 2022-05-27 ENCOUNTER — Ambulatory Visit: Payer: 59 | Admitting: Dermatology

## 2023-12-22 ENCOUNTER — Ambulatory Visit (INDEPENDENT_AMBULATORY_CARE_PROVIDER_SITE_OTHER): Payer: Self-pay

## 2023-12-22 DIAGNOSIS — K640 First degree hemorrhoids: Secondary | ICD-10-CM | POA: Diagnosis not present

## 2023-12-22 DIAGNOSIS — Z1211 Encounter for screening for malignant neoplasm of colon: Secondary | ICD-10-CM | POA: Diagnosis present
